# Patient Record
Sex: Female | Born: 1990 | Race: Black or African American | Hispanic: No | Marital: Single | Smoking: Never smoker
Health system: Southern US, Community
[De-identification: ages and names within clinical notes are randomized; demographics above are authoritative.]

## PROBLEM LIST (undated history)

## (undated) DIAGNOSIS — E282 Polycystic ovarian syndrome: Secondary | ICD-10-CM

## (undated) DIAGNOSIS — F419 Anxiety disorder, unspecified: Secondary | ICD-10-CM

## (undated) DIAGNOSIS — F32A Depression, unspecified: Secondary | ICD-10-CM

## (undated) DIAGNOSIS — Z202 Contact with and (suspected) exposure to infections with a predominantly sexual mode of transmission: Secondary | ICD-10-CM

## (undated) DIAGNOSIS — F329 Major depressive disorder, single episode, unspecified: Secondary | ICD-10-CM

## (undated) DIAGNOSIS — J302 Other seasonal allergic rhinitis: Secondary | ICD-10-CM

## (undated) DIAGNOSIS — N870 Mild cervical dysplasia: Secondary | ICD-10-CM

## (undated) HISTORY — DX: Anxiety disorder, unspecified: F41.9

## (undated) HISTORY — DX: Contact with and (suspected) exposure to infections with a predominantly sexual mode of transmission: Z20.2

## (undated) HISTORY — DX: Polycystic ovarian syndrome: E28.2

## (undated) HISTORY — DX: Other seasonal allergic rhinitis: J30.2

## (undated) HISTORY — DX: Major depressive disorder, single episode, unspecified: F32.9

## (undated) HISTORY — DX: Depression, unspecified: F32.A

## (undated) HISTORY — DX: Mild cervical dysplasia: N87.0

---
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
## 2012-01-04 ENCOUNTER — Other Ambulatory Visit (HOSPITAL_COMMUNITY)
Admission: RE | Admit: 2012-01-04 | Discharge: 2012-01-04 | Disposition: A | Discharge status: Home / Self Care | Payer: Federal, State, Local not specified - PPO | Source: Ambulatory Visit | Attending: Obstetrics and Gynecology | Admitting: Obstetrics and Gynecology

## 2012-01-04 ENCOUNTER — Encounter: Payer: Self-pay | Admitting: Obstetrics and Gynecology

## 2012-01-04 ENCOUNTER — Ambulatory Visit (INDEPENDENT_AMBULATORY_CARE_PROVIDER_SITE_OTHER): Payer: Federal, State, Local not specified - PPO | Admitting: Obstetrics and Gynecology

## 2012-01-04 VITALS — BP 128/78 | Ht 60.0 in | Wt 176.0 lb

## 2012-01-04 DIAGNOSIS — Z01419 Encounter for gynecological examination (general) (routine) without abnormal findings: Secondary | ICD-10-CM | POA: Insufficient documentation

## 2012-01-04 DIAGNOSIS — Z113 Encounter for screening for infections with a predominantly sexual mode of transmission: Secondary | ICD-10-CM

## 2012-01-04 LAB — CBC WITH DIFFERENTIAL/PLATELET
Basophils Absolute: 0 10*3/uL (ref 0.0–0.1)
Basophils Relative: 0 % (ref 0–1)
Eosinophils Absolute: 0.1 10*3/uL (ref 0.0–0.7)
Eosinophils Relative: 1 % (ref 0–5)
HCT: 38.2 % (ref 36.0–46.0)
Hemoglobin: 11.9 g/dL — ABNORMAL LOW (ref 12.0–15.0)
Lymphocytes Relative: 35 % (ref 12–46)
Lymphs Abs: 1.8 10*3/uL (ref 0.7–4.0)
MCH: 25.9 pg — ABNORMAL LOW (ref 26.0–34.0)
MCHC: 31.2 g/dL (ref 30.0–36.0)
MCV: 83 fL (ref 78.0–100.0)
Monocytes Absolute: 0.2 10*3/uL (ref 0.1–1.0)
Monocytes Relative: 5 % (ref 3–12)
Neutro Abs: 2.9 10*3/uL (ref 1.7–7.7)
Neutrophils Relative %: 59 % (ref 43–77)
Platelets: 280 10*3/uL (ref 150–400)
RBC: 4.6 MIL/uL (ref 3.87–5.11)
RDW: 14.3 % (ref 11.5–15.5)
WBC: 5 10*3/uL (ref 4.0–10.5)

## 2012-01-04 MED ORDER — DESOGESTREL-ETHINYL ESTRADIOL 0.15-0.02/0.01 MG (21/5) PO TABS: 1.0000 | ORAL_TABLET | Freq: Every day | ORAL | Status: DC

## 2012-01-04 NOTE — Progress Notes (Signed)
Patient came to see me today for her annual GYN exam. She remains on birth control pills and is doing well on them. She has short cycles without abnormal bleeding. She is not having pelvic pain. 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 Her last Pap smear was June of 2012 and was normal. High risk HPV is not been detected. She was declined as a blood doner due to anemia. She was last sexually active in March.  Physical examination: Beola Cord present. HEENT within normal limits. Neck: Thyroid not large. No masses. Supraclavicular nodes: not enlarged. Breasts: Examined in both sitting and lying  position. No skin changes and no masses. Abdomen: Soft no guarding rebound or masses or hernia. Pelvic: External: Within normal limits. BUS: Within normal limits. Vaginal:within normal limits. Good estrogen effect. No evidence of cystocele rectocele or enterocele. Cervix: clean. Uterus: Normal size and shape. Adnexa: No masses. Rectovaginal exam: Confirmatory and negative. Extremities: Within normal limits.  Assessment: CIN-1  Plan: Continue birth control pills. GC and chlamydial  cultures done. We decided to do a Pap smear today and then will go to every 3 year screening.

## 2012-01-05 LAB — URINALYSIS W MICROSCOPIC + REFLEX CULTURE
Bacteria, UA: NONE SEEN
Bilirubin Urine: NEGATIVE
Casts: NONE SEEN
Crystals: NONE SEEN
Glucose, UA: NEGATIVE mg/dL
Hgb urine dipstick: NEGATIVE
Ketones, ur: NEGATIVE mg/dL
Leukocytes, UA: NEGATIVE
Nitrite: NEGATIVE
Protein, ur: NEGATIVE mg/dL
Specific Gravity, Urine: 1.027 (ref 1.005–1.030)
Squamous Epithelial / LPF: NONE SEEN
Urobilinogen, UA: 0.2 mg/dL (ref 0.0–1.0)
pH: 6 (ref 5.0–8.0)

## 2012-01-05 LAB — GC/CHLAMYDIA PROBE AMP, GENITAL
Chlamydia, DNA Probe: NEGATIVE
GC Probe Amp, Genital: NEGATIVE

## 2012-01-25 ENCOUNTER — Ambulatory Visit (HOSPITAL_COMMUNITY): Payer: Federal, State, Local not specified - PPO | Admitting: Psychiatry

## 2012-03-07 ENCOUNTER — Other Ambulatory Visit: Payer: Self-pay | Admitting: Obstetrics and Gynecology

## 2012-03-28 ENCOUNTER — Ambulatory Visit (HOSPITAL_COMMUNITY): Payer: Federal, State, Local not specified - PPO | Admitting: Psychiatry

## 2012-03-28 ENCOUNTER — Encounter (HOSPITAL_COMMUNITY): Payer: Self-pay | Admitting: Psychiatry

## 2012-03-28 VITALS — BP 122/82 | Ht 60.0 in | Wt 176.6 lb

## 2012-03-28 DIAGNOSIS — F411 Generalized anxiety disorder: Secondary | ICD-10-CM

## 2012-03-28 MED ORDER — BUSPIRONE HCL 10 MG PO TABS: 10.0000 mg | ORAL_TABLET | Freq: Two times a day (BID) | ORAL | Status: DC

## 2012-03-28 NOTE — Progress Notes (Signed)
Patient ID: Yolanda Callahan, female   DOB: 01-30-1991, 21 y.o.   MRN: [id number]   [facility] Follow-up Outpatient Visit  Yolanda Callahan May 21, 1991  Date:   Subjective: I am doing well at college but I did change to [employer]. I'm taking honor classes. I'm doing well with my anxiety and I am also sleeping well at night. I'm not having any depressive symptoms, any thoughts of self-harm or harm to others. There are no side effects with the medications. There no safety issues as per patient's report at this visit   Filed Vitals:   03/28/12 1028  BP: 122/82    Mental Status Examination  Appearance: Casually depressed Alert: Yes Attention: fair  Cooperative: Yes Eye Contact: Fair Speech: Normal in volume, rate, tone, spontaneous  Psychomotor Activity: Normal Memory/Concentration: OK Oriented: person, place and situation Mood: Euthymic Affect: Full Range Thought Processes and Associations: Goal Directed Fund of Knowledge: Good Thought Content: Suicidal ideation, Homicidal ideation, Auditory hallucinations, Visual hallucinations, Delusions and Paranoia, none reported Insight: Fair Judgement: Fair  Diagnosis: Generalized anxiety disorder  Treatment Plan: Continue BuSpar to 10 mg one pill twice daily for anxiety Continue melatonin 3 mg one or 2 pills at bedtime when necessary for sleep. Discussed diet and exercise in length at this visit with patient as she is overweight  call when necessary Followup in 3 months  Nelly Rout, MD

## 2012-05-06 ENCOUNTER — Ambulatory Visit: Payer: Federal, State, Local not specified - PPO | Admitting: Women's Health

## 2012-05-06 ENCOUNTER — Ambulatory Visit (INDEPENDENT_AMBULATORY_CARE_PROVIDER_SITE_OTHER): Payer: Federal, State, Local not specified - PPO | Admitting: Emergency Medicine

## 2012-05-06 VITALS — BP 122/79 | HR 65 | Temp 98.2°F | Resp 16 | Ht 60.0 in | Wt 178.4 lb

## 2012-05-06 DIAGNOSIS — H1030 Unspecified acute conjunctivitis, unspecified eye: Secondary | ICD-10-CM

## 2012-05-06 MED ORDER — CIPROFLOXACIN HCL 0.3 % OP SOLN: 1.0000 [drp] | OPHTHALMIC | Status: DC

## 2012-05-06 NOTE — Progress Notes (Signed)
[facility] [address], [place] [address] [phone]  Date:  05/06/2012   Name:  Yolanda Callahan   DOB:  11/05/1990   MRN:  [id number]  PCP:  No primary provider on file.    Chief Complaint: Conjunctivitis   History of Present Illness:  Yolanda Callahan is a 21 y.o. very pleasant female patient who presents with the following:  One week history of right eye conjunctivitis now spreading to the left eye.  No FB sensation or visual symptoms.  Painless.  No antecedent illness.  Patient Active Problem List  Diagnosis  . GAD (generalized anxiety disorder)  . CIN I (cervical intraepithelial neoplasia I)    Past Medical History  Diagnosis Date  . Asthma   . Seasonal allergies   . Polycystic ovarian disease   . CIN I (cervical intraepithelial neoplasia I) 2012  . Anxiety     Past Surgical History  Procedure Date  . Mouth surgery   . Colposcopy 2012    History  Substance Use Topics  . Smoking status: Never Smoker   . Smokeless tobacco: Not on file  . Alcohol Use: No    Family History  Problem Relation Age of Onset  . Anxiety disorder Father   . Hypertension Father   . Anxiety disorder Paternal Grandmother   . Depression Paternal Grandmother   . Diabetes Paternal Grandmother   . Hypertension Mother   . Heart disease Maternal Grandmother   . Diabetes Paternal Grandfather   . Hypertension Maternal Grandfather     Allergies  Allergen Reactions  . Penicillins     Medication list has been reviewed and updated.  Current Outpatient Prescriptions on File Prior to Visit  Medication Sig Dispense Refill  . busPIRone (BUSPAR) 10 MG tablet Take 1 tablet (10 mg total) by mouth 2 (two) times daily.  60 tablet  2  . Multiple Vitamin (MULTIVITAMIN) tablet Take 1 tablet by mouth daily.      Marland Kitchen VIORELE 0.15-0.02/0.01 MG (21/5) tablet TAKE 1 TABLET BY MOUTH DAILY.  28 tablet  11  . desogestrel-ethinyl estradiol (KARIVA) 0.15-0.02/0.01 MG (21/5)  tablet Take 1 tablet by mouth daily.  1 Package  14    Review of Systems:  As per HPI, otherwise negative.    Physical Examination: Filed Vitals:   05/06/12 1450  BP: 122/79  Pulse: 65  Temp: 98.2 F (36.8 C)  Resp: 16   Filed Vitals:   05/06/12 1450  Height: 5' (1.524 m)  Weight: 178 lb 6.4 oz (80.922 kg)   Body mass index is 34.84 kg/(m^2). Ideal Body Weight: Weight in (lb) to have BMI = 25: 127.7    GEN: WDWN, NAD, Non-toxic, Alert & Oriented x 3 HEENT: Atraumatic, Normocephalic.  Ears and Nose: No external deformity. EXTR: No clubbing/cyanosis/edema NEURO: Normal gait.  PSYCH: Normally interactive. Conversant. Not depressed or anxious appearing.  Calm demeanor.  PRRERLA EOMI bilateral conjunctival injection and gluing.  No fb.  Assessment and Plan: Conjunctivitis. ciloxin   Carmelina Dane, MD  I have reviewed and agree with documentation. Robert P. Merla Riches, M.D.

## 2012-05-10 ENCOUNTER — Other Ambulatory Visit (HOSPITAL_COMMUNITY): Payer: Self-pay | Admitting: Psychiatry

## 2012-06-27 ENCOUNTER — Ambulatory Visit (HOSPITAL_COMMUNITY): Payer: Federal, State, Local not specified - PPO | Admitting: Psychiatry

## 2012-09-06 ENCOUNTER — Encounter (HOSPITAL_COMMUNITY): Payer: Self-pay | Admitting: Psychiatry

## 2012-09-06 ENCOUNTER — Ambulatory Visit (INDEPENDENT_AMBULATORY_CARE_PROVIDER_SITE_OTHER): Payer: Federal, State, Local not specified - PPO | Admitting: Psychiatry

## 2012-09-06 VITALS — BP 122/80 | Ht 60.0 in | Wt 182.6 lb

## 2012-09-06 DIAGNOSIS — F411 Generalized anxiety disorder: Secondary | ICD-10-CM

## 2012-09-06 MED ORDER — BUSPIRONE HCL 10 MG PO TABS: 10.0000 mg | ORAL_TABLET | Freq: Two times a day (BID) | ORAL | Status: DC

## 2012-09-06 NOTE — Progress Notes (Signed)
Patient ID: Yolanda Callahan, female   DOB: 09-25-1990, 22 y.o.   MRN: [id number]   [facility] Follow-up Outpatient Visit  DEXTER SAUSER 12-04-1990     Subjective: I doing well at [employer], my grades are good, and staying on campus but I do visit my family regularly. On a scale of 0-10, with 0 being no symptoms and 10 being the worst my anxiety is a 1/10. Patient denies any side effects of the medication, any complaints, any safety issues at this visit   Current outpatient prescriptions:busPIRone (BUSPAR) 10 MG tablet, Take 1 tablet (10 mg total) by mouth 2 (two) times daily., Disp: 60 tablet, Rfl: 3;  ciprofloxacin (CILOXAN) 0.3 % ophthalmic solution, Place 1 drop into both eyes every 2 (two) hours. Administer 1 drop, every 2 hours, while awake, for 2 days. Then 1 drop, every 4 hours, while awake, for the next 5 days., Disp: 5 mL, Rfl: 0 desogestrel-ethinyl estradiol (KARIVA) 0.15-0.02/0.01 MG (21/5) tablet, Take 1 tablet by mouth daily., Disp: 1 Package, Rfl: 14;  Multiple Vitamin (MULTIVITAMIN) tablet, Take 1 tablet by mouth daily., Disp: , Rfl: ;  VIORELE 0.15-0.02/0.01 MG (21/5) tablet, TAKE 1 TABLET BY MOUTH DAILY., Disp: 28 tablet, Rfl: 11 Filed Vitals:   09/06/12 1130  BP: 122/80   Review of Systems  Constitutional: Negative.  Negative for fever and weight loss.  HENT: Negative.  Negative for congestion and sore throat.   Cardiovascular: Negative.  Negative for chest pain and palpitations.  Neurological: Negative for weakness and headaches.  Psychiatric/Behavioral: Negative.  Negative for depression, suicidal ideas, hallucinations, memory loss and substance abuse. The patient is not nervous/anxious and does not have insomnia.    Mental Status Examination  Appearance: Casually depressed Alert: Yes Attention: fair  Cooperative: Yes Eye Contact: Fair Speech: Normal in volume, rate, tone, spontaneous  Psychomotor Activity: Normal Memory/Concentration:  OK Oriented: person, place and situation Mood: Euthymic Affect: Full Range Thought Processes and Associations: Goal Directed Fund of Knowledge: Good Thought Content: Suicidal ideation, Homicidal ideation, Auditory hallucinations, Visual hallucinations, Delusions and Paranoia, none reported Insight: Fair Judgement: Fair  Diagnosis: Generalized anxiety disorder  Treatment Plan: Continue BuSpar to 10 mg one pill twice daily for anxiety Continue melatonin 3 mg one or 2 pills at bedtime when necessary for sleep. Discussed again diet and exercise in length at this visit with patient as she is overweight but patient feels that she's not overweight and does not struggle with self image Call when necessary Followup in 4 months  Nelly Rout, MD

## 2012-10-27 ENCOUNTER — Encounter (HOSPITAL_COMMUNITY): Payer: Self-pay

## 2012-10-27 ENCOUNTER — Encounter (HOSPITAL_COMMUNITY): Payer: Self-pay | Admitting: Psychiatry

## 2012-10-27 ENCOUNTER — Ambulatory Visit (INDEPENDENT_AMBULATORY_CARE_PROVIDER_SITE_OTHER): Payer: Federal, State, Local not specified - PPO | Admitting: Psychiatry

## 2012-10-27 VITALS — BP 132/92 | Ht 60.0 in | Wt 187.4 lb

## 2012-10-27 DIAGNOSIS — F411 Generalized anxiety disorder: Secondary | ICD-10-CM

## 2012-10-27 DIAGNOSIS — F329 Major depressive disorder, single episode, unspecified: Secondary | ICD-10-CM | POA: Insufficient documentation

## 2012-10-27 MED ORDER — BUSPIRONE HCL 10 MG PO TABS: 10.0000 mg | ORAL_TABLET | Freq: Two times a day (BID) | ORAL | Status: DC

## 2012-10-27 MED ORDER — BUPROPION HCL ER (XL) 150 MG PO TB24: 150.0000 mg | ORAL_TABLET | ORAL | Status: DC

## 2012-10-27 NOTE — Progress Notes (Signed)
Patient ID: Yolanda Callahan, female   DOB: October 02, 1990, 22 y.o.   MRN: [id number]   [facility] Follow-up Outpatient Visit  Yolanda Callahan 16-Sep-1990     Subjective: I doing well at [employer], my grades are good but I am struggling with my mood,. I am tired a lot ,  I struggle with my concentration, I have had thoughts of not wanting to live but no thoughts of killing myself. I also get irritated easily. I am still staying on campus but I do visit my family regularly. Mom would prefer patient to return home so that the stress on the patient is less. On a scale of 0-10, with 0 being no symptoms and 10 being the worst my anxiety is a 6/10 and my depression is also a 6/10. Mom feels patient is doing too much as patient is in honor classes and is also working a lot. Patient denies any other complaints, any side effects of the medication, any safety issues at this visit   Active Ambulatory Problems    Diagnosis Date Noted  . GAD (generalized anxiety disorder) 07/23/2011  . CIN I (cervical intraepithelial neoplasia I)    Resolved Ambulatory Problems    Diagnosis Date Noted  . No Resolved Ambulatory Problems   Past Medical History  Diagnosis Date  . Asthma   . Seasonal allergies   . Polycystic ovarian disease   . Anxiety     Current outpatient prescriptions:buPROPion (WELLBUTRIN XL) 150 MG 24 hr tablet, Take 1 tablet (150 mg total) by mouth every morning., Disp: 30 tablet, Rfl: 2;  busPIRone (BUSPAR) 10 MG tablet, Take 1 tablet (10 mg total) by mouth 2 (two) times daily., Disp: 60 tablet, Rfl: 3 ciprofloxacin (CILOXAN) 0.3 % ophthalmic solution, Place 1 drop into both eyes every 2 (two) hours. Administer 1 drop, every 2 hours, while awake, for 2 days. Then 1 drop, every 4 hours, while awake, for the next 5 days., Disp: 5 mL, Rfl: 0;  desogestrel-ethinyl estradiol (KARIVA) 0.15-0.02/0.01 MG (21/5) tablet, Take 1 tablet by mouth daily., Disp: 1 Package, Rfl: 14 Multiple Vitamin  (MULTIVITAMIN) tablet, Take 1 tablet by mouth daily., Disp: , Rfl: ;  VIORELE 0.15-0.02/0.01 MG (21/5) tablet, TAKE 1 TABLET BY MOUTH DAILY., Disp: 28 tablet, Rfl: 11 Filed Vitals:   10/27/12 1039  BP: 132/92   Review of Systems  Constitutional: Negative.  Negative for fever and weight loss.  HENT: Negative.  Negative for congestion and sore throat.   Cardiovascular: Negative.  Negative for chest pain and palpitations.  Neurological: Negative.  Negative for dizziness, focal weakness, seizures, loss of consciousness, weakness and headaches.  Psychiatric/Behavioral: Positive for depression and suicidal ideas. Negative for hallucinations, memory loss and substance abuse. The patient is not nervous/anxious and does not have insomnia.    Mental Status Examination  Appearance: Casually depressed Alert: Yes Attention: fair  Cooperative: Yes Eye Contact: Fair Speech: Normal in volume, rate, tone, spontaneous  Psychomotor Activity: Normal Memory/Concentration: so, so Oriented: person, place and situation Mood: Anxious, Depressed and Dysphoric Affect: Full Range and ruminative thoughts. Thought Processes and Associations: Goal Directed Fund of Knowledge: Good Thought Content: Suicidal ideation, Homicidal ideation, Auditory hallucinations, Visual hallucinations, Delusions and Paranoia, none reported Insight: Fair Judgement: Fair  Diagnosis: Generalized anxiety disorder  Treatment Plan To start Wellbutrin XL 150 MG one daliy for depression and to help with focus. The risks and benefits were discussed with Mom and patient and verbal consent was obtained. Continue BuSpar to  10 mg one pill twice daily for anxiety Continue melatonin 3 mg one or 2 pills at bedtime when necessary for sleep. Discussed again diet and exercise in length at this visit  Call when necessary Followup in 4 weeks 50 % of the visit was spent in counseling the patient in regards to the diagnosis of  depression, treatment  options, coarse of the illness. Also crisis and safety plan was discussed even does patient denied any current safety concerns.  Nelly Rout, MD

## 2012-11-09 ENCOUNTER — Ambulatory Visit (HOSPITAL_COMMUNITY): Payer: Self-pay | Admitting: Psychiatry

## 2012-11-15 ENCOUNTER — Ambulatory Visit (INDEPENDENT_AMBULATORY_CARE_PROVIDER_SITE_OTHER): Payer: Federal, State, Local not specified - PPO | Admitting: Physician Assistant

## 2012-11-15 VITALS — BP 135/84 | HR 93 | Temp 98.0°F | Resp 16 | Ht 60.0 in | Wt 189.0 lb

## 2012-11-15 DIAGNOSIS — J45901 Unspecified asthma with (acute) exacerbation: Secondary | ICD-10-CM

## 2012-11-15 DIAGNOSIS — R059 Cough, unspecified: Secondary | ICD-10-CM

## 2012-11-15 DIAGNOSIS — R062 Wheezing: Secondary | ICD-10-CM

## 2012-11-15 DIAGNOSIS — R05 Cough: Secondary | ICD-10-CM

## 2012-11-15 DIAGNOSIS — R0602 Shortness of breath: Secondary | ICD-10-CM

## 2012-11-15 MED ORDER — ALBUTEROL SULFATE HFA 108 (90 BASE) MCG/ACT IN AERS: 2.0000 | INHALATION_SPRAY | RESPIRATORY_TRACT | Status: DC | PRN

## 2012-11-15 MED ORDER — MOMETASONE FUROATE 50 MCG/ACT NA SUSP: 2.0000 | Freq: Every day | NASAL | Status: DC

## 2012-11-15 MED ORDER — E-Z SPACER DEVI: Status: DC

## 2012-11-15 MED ORDER — MONTELUKAST SODIUM 10 MG PO TABS: 10.0000 mg | ORAL_TABLET | Freq: Every day | ORAL | Status: DC

## 2012-11-15 MED ORDER — HYDROCODONE-HOMATROPINE 5-1.5 MG/5ML PO SYRP: ORAL_SOLUTION | ORAL | Status: DC

## 2012-11-15 MED ORDER — IPRATROPIUM BROMIDE 0.02 % IN SOLN
0.5000 mg | Freq: Once | RESPIRATORY_TRACT | Status: AC
  Administered 2012-11-15: 0.5 mg via RESPIRATORY_TRACT

## 2012-11-15 MED ORDER — ALBUTEROL SULFATE (2.5 MG/3ML) 0.083% IN NEBU
2.5000 mg | INHALATION_SOLUTION | Freq: Once | RESPIRATORY_TRACT | Status: AC
  Administered 2012-11-15: 2.5 mg via RESPIRATORY_TRACT

## 2012-11-15 MED ORDER — PREDNISONE 20 MG PO TABS: ORAL_TABLET | ORAL | Status: DC

## 2012-11-15 NOTE — Progress Notes (Signed)
Patient ID: Yolanda Callahan MRN: [id number], DOB: July 25, 1990, 22 y.o. Date of Encounter: 11/15/2012, 4:02 PM  Primary Physician: No primary provider on file.  Chief Complaint: Asthma   HPI: 22 y.o. female with history below presents with asthma exacerbation. States her asthma always flares up this time of year with the allergies. Her allergies have since calmed down some. She notes increased cough, SOB, and wheezing. Symptoms are worse at night. She has been using her Albuterol inhaler 4-5 times per day and even more at night time. Her roommate has been insisting that the bedroom window stay open at nighttime along with the fan, this is causing a flare up of her symptoms. Some sore throat, however she feels like this is secondary to irritation from coughing only. She denies any fevers, chills, itchy, watery eyes, or N/V/D. She does not feel air hungry. States her asthma always flares up this time of year.   Previously she has taken Singulair nightly for her allergies and asthma and had good results. She last took this during her senior of high school. At baseline she does not require usage of her Albuterol and her asthma is well controlled.     Past Medical History  Diagnosis Date  . Asthma   . Seasonal allergies   . Polycystic ovarian disease   . CIN I (cervical intraepithelial neoplasia I) 2012  . Anxiety   . Depression      Home Meds: Prior to Admission medications   Medication Sig Start Date End Date Taking? Authorizing Provider  buPROPion (WELLBUTRIN XL) 150 MG 24 hr tablet Take 1 tablet (150 mg total) by mouth every morning. 10/27/12 10/27/13 Yes Nelly Rout, MD  busPIRone (BUSPAR) 10 MG tablet Take 1 tablet (10 mg total) by mouth 2 (two) times daily. 10/27/12  Yes Nelly Rout, MD  Multiple Vitamin (MULTIVITAMIN) tablet Take 1 tablet by mouth daily.   Yes Historical Provider, MD  VIORELE 0.15-0.02/0.01 MG (21/5) tablet TAKE 1 TABLET BY MOUTH DAILY. 03/07/12  Yes Trellis Paganini, MD  desogestrel-ethinyl estradiol (KARIVA) 0.15-0.02/0.01 MG (21/5) tablet Take 1 tablet by mouth daily. 01/04/12 01/03/13 No Trellis Paganini, MD    Allergies:  Allergies  Allergen Reactions  . Penicillins     History   Social History  . Marital Status: Single    Spouse Name: N/A    Number of Children: N/A  . Years of Education: N/A   Occupational History  . Not on file.   Social History Main Topics  . Smoking status: Never Smoker   . Smokeless tobacco: Not on file  . Alcohol Use: No  . Drug Use: No  . Sexually Active: Yes    Birth Control/ Protection: Pill   Other Topics Concern  . Not on file   Social History Narrative  . No narrative on file     Review of Systems: Constitutional: positive for fatigue. negative for chills or fever  HEENT: positive for congestion, rhinorrhea, and ST. negative for vision changes, hearing loss, or sinus pressure Cardiovascular: negative for chest pain or palpitations Respiratory: positive for cough, SOB, and wheezing. negative for hemoptysis, or chest pain   Physical Exam: Blood pressure 135/84, pulse 93, temperature 98 F (36.7 C), temperature source Oral, resp. rate 16, height 5' (1.524 m), weight 189 lb (85.73 kg), last menstrual period 10/15/2012, SpO2 96.00%., Body mass index is 36.91 kg/(m^2). General: Well developed, well nourished, in no acute distress. Head: Normocephalic, atraumatic, eyes without discharge, sclera non-icteric,  nares are without discharge. Bilateral auditory canals clear, TM's are without perforation, pearly grey and translucent with reflective cone of light bilaterally. Oral cavity moist, posterior pharynx with post nasal drip. No exudate, erythema, or peritonsillar abscess. Uvula midline.  Neck: Supple. No thyromegaly. Full ROM. No lymphadenopathy. Lungs: Expiratory wheezing bilaterally. No rales or rhonchi. Breathing is unlabored. Heart: RRR with S1 S2. No murmurs, rubs, or gallops  appreciated. Msk:  Strength and tone normal for age. Extremities/Skin: Warm and dry. No clubbing or cyanosis. No edema. No rashes or suspicious lesions. Neuro: Alert and oriented X 3. Moves all extremities spontaneously. Gait is normal. CNII-XII grossly in tact. Psych:  Responds to questions appropriately with a normal affect.   S/P Albuterol and Atrovent neb:    ASSESSMENT AND PLAN:  22 y.o. female with acute asthma exacerbation, SOB, wheezing, cough, and allergies  -Albuterol and Atrovent neb per above -Prednisone 20 mg #18 3x3, 2x3, 1x3 no RF -Proventil 2 puffs inhaled q 4-6 hours prn #1 no RF -Hycodan #4oz 1 tsp po q 4-6 hours prn cough no RF SED -Singulair 10 mg 1 po qhs #30 RF 11 -Nasonex 2 sprays daily #1 RF 12 -1 tsp po of local honey daily -Change air filters regularly with quality air filters -Discuss asthma with roommate, may need to stay with parents at this time -RTC precautions   Signed, Eula Listen, PA-C 11/15/2012 4:02 PM

## 2012-11-23 ENCOUNTER — Ambulatory Visit (INDEPENDENT_AMBULATORY_CARE_PROVIDER_SITE_OTHER): Payer: Federal, State, Local not specified - PPO | Admitting: Psychiatry

## 2012-11-23 DIAGNOSIS — F411 Generalized anxiety disorder: Secondary | ICD-10-CM

## 2012-11-23 DIAGNOSIS — F32A Depression, unspecified: Secondary | ICD-10-CM

## 2012-11-23 DIAGNOSIS — F329 Major depressive disorder, single episode, unspecified: Secondary | ICD-10-CM

## 2012-11-24 ENCOUNTER — Encounter (HOSPITAL_COMMUNITY): Payer: Self-pay | Admitting: Psychiatry
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
## 2012-12-06 ENCOUNTER — Ambulatory Visit (HOSPITAL_COMMUNITY): Payer: Self-pay | Admitting: Psychiatry

## 2012-12-07 ENCOUNTER — Ambulatory Visit (INDEPENDENT_AMBULATORY_CARE_PROVIDER_SITE_OTHER): Payer: Federal, State, Local not specified - PPO | Admitting: Psychiatry

## 2012-12-07 ENCOUNTER — Encounter (HOSPITAL_COMMUNITY): Payer: Self-pay | Admitting: Psychiatry

## 2012-12-07 DIAGNOSIS — F329 Major depressive disorder, single episode, unspecified: Secondary | ICD-10-CM

## 2012-12-07 NOTE — Progress Notes (Signed)
   THERAPIST PROGRESS NOTE  Session Time: 11:30-12:20  Participation Level: Active  Behavioral Response: CasualAlertEuthymic  Type of Therapy: Individual Therapy  Treatment Goals addressed: coping skills, boundary setting  Interventions: CBT  Summary: Yolanda Callahan is a 22 y.o. female who presents with depression and anxiety.   Suicidal/Homicidal: Nowithout intent/plan  Therapist Response: Client processed decision to move home from on campus housing and career planning. Pt. Reported significant improvements in healthy diet, exercise 3X a week and improvements in sleep schedule, considering limiting social media.  Plan: Return again in 1-2 weeks.  Diagnosis: Axis I: Depressive Disorder NOS    Axis II: No diagnosis    [name] 12/07/2012

## 2012-12-19 ENCOUNTER — Ambulatory Visit (HOSPITAL_COMMUNITY): Payer: Self-pay | Admitting: Psychiatry

## 2012-12-19 ENCOUNTER — Ambulatory Visit (HOSPITAL_COMMUNITY): Payer: Federal, State, Local not specified - PPO | Admitting: Psychiatry

## 2012-12-21 ENCOUNTER — Ambulatory Visit (INDEPENDENT_AMBULATORY_CARE_PROVIDER_SITE_OTHER): Payer: Federal, State, Local not specified - PPO | Admitting: Psychiatry

## 2012-12-21 DIAGNOSIS — F411 Generalized anxiety disorder: Secondary | ICD-10-CM

## 2012-12-21 DIAGNOSIS — F329 Major depressive disorder, single episode, unspecified: Secondary | ICD-10-CM

## 2012-12-22 ENCOUNTER — Encounter (HOSPITAL_COMMUNITY): Payer: Self-pay | Admitting: Psychiatry

## 2012-12-22 NOTE — Progress Notes (Signed)
   THERAPIST PROGRESS NOTE  Session Time: 11:30-12:20  Participation Level: Active  Behavioral Response: CasualAlertEuthymic  Type of Therapy: Individual Therapy  Treatment Goals addressed: anxiety  Interventions: CBT  Summary: Yolanda Callahan is a 22 y.o. female who presents with anxiety and depression.   Suicidal/Homicidal: Nowithout intent/plan  Therapist Response: Pt. Reported improvement in anxiety as evidenced by ability to focus on healthy eating plan and regular exercise. Pt. Reported good relationships with primary support system- parents, brother, friends. Focused on themes related to self-worth, people pleasing/fears of confrontation. Encouraged Pt. To identify interests and activities that bring her joy. Pt. Was able to identify sitting on the lawn, drawing, and reading.  Plan: Return again in 1-2 weeks.  Diagnosis: Axis I: Depressive Disorder NOS    Axis II: No diagnosis    Yolanda Callahan 12/22/2012

## 2013-01-04 ENCOUNTER — Encounter: Payer: Self-pay | Admitting: Women's Health

## 2013-01-04 ENCOUNTER — Ambulatory Visit (INDEPENDENT_AMBULATORY_CARE_PROVIDER_SITE_OTHER): Payer: Federal, State, Local not specified - PPO | Admitting: Women's Health

## 2013-01-04 VITALS — BP 118/76 | Ht 60.0 in | Wt 180.0 lb

## 2013-01-04 DIAGNOSIS — Z01419 Encounter for gynecological examination (general) (routine) without abnormal findings: Secondary | ICD-10-CM

## 2013-01-04 DIAGNOSIS — IMO0001 Reserved for inherently not codable concepts without codable children: Secondary | ICD-10-CM

## 2013-01-04 DIAGNOSIS — N912 Amenorrhea, unspecified: Secondary | ICD-10-CM

## 2013-01-04 DIAGNOSIS — Z23 Encounter for immunization: Secondary | ICD-10-CM

## 2013-01-04 DIAGNOSIS — Z113 Encounter for screening for infections with a predominantly sexual mode of transmission: Secondary | ICD-10-CM

## 2013-01-04 DIAGNOSIS — Z309 Encounter for contraceptive management, unspecified: Secondary | ICD-10-CM

## 2013-01-04 DIAGNOSIS — Z833 Family history of diabetes mellitus: Secondary | ICD-10-CM

## 2013-01-04 LAB — CBC WITH DIFFERENTIAL/PLATELET
Basophils Absolute: 0 10*3/uL (ref 0.0–0.1)
Basophils Relative: 0 % (ref 0–1)
Eosinophils Absolute: 0.1 10*3/uL (ref 0.0–0.7)
Eosinophils Relative: 3 % (ref 0–5)
HCT: 36.4 % (ref 36.0–46.0)
Hemoglobin: 12.4 g/dL (ref 12.0–15.0)
Lymphocytes Relative: 36 % (ref 12–46)
Lymphs Abs: 1.7 10*3/uL (ref 0.7–4.0)
MCH: 26.6 pg (ref 26.0–34.0)
MCHC: 34.1 g/dL (ref 30.0–36.0)
MCV: 78.1 fL (ref 78.0–100.0)
Monocytes Absolute: 0.5 10*3/uL (ref 0.1–1.0)
Monocytes Relative: 10 % (ref 3–12)
Neutro Abs: 2.4 10*3/uL (ref 1.7–7.7)
Neutrophils Relative %: 51 % (ref 43–77)
Platelets: 294 10*3/uL (ref 150–400)
RBC: 4.66 MIL/uL (ref 3.87–5.11)
RDW: 14.5 % (ref 11.5–15.5)
WBC: 4.8 10*3/uL (ref 4.0–10.5)

## 2013-01-04 LAB — GLUCOSE, RANDOM: Glucose, Bld: 87 mg/dL (ref 70–99)

## 2013-01-04 LAB — PROLACTIN: Prolactin: 23.5 ng/mL

## 2013-01-04 LAB — HCG, SERUM, QUALITATIVE: Preg, Serum: NEGATIVE

## 2013-01-04 LAB — TSH: TSH: 2.065 u[IU]/mL (ref 0.350–4.500)

## 2013-01-04 MED ORDER — DESOGESTREL-ETHINYL ESTRADIOL 0.15-0.02/0.01 MG (21/5) PO TABS: ORAL_TABLET | ORAL | Status: DC

## 2013-01-04 NOTE — Patient Instructions (Addendum)
Health Maintenance, 18- to 21-Year-Old SCHOOL PERFORMANCE After high school completion, the young adult may be attending college, technical or vocational school, or entering the military or the work force. SOCIAL AND EMOTIONAL DEVELOPMENT The young adult establishes adult relationships and explores sexual identity. Young adults may be living at home or in a college dorm or apartment. Increasing independence is important with young adults. Throughout adolescence, teens should assume responsibility of their own health care. IMMUNIZATIONS Most young adults should be fully vaccinated. A booster dose of Tdap (tetanus, diphtheria, and pertussis, or "whooping cough"), a dose of meningococcal vaccine to protect against a certain type of bacterial meningitis, hepatitis A, human papillomarvirus (HPV), chickenpox, or measles vaccines may be indicated, if not given at an earlier age. Annual influenza or "flu" vaccination should be considered during flu season.  TESTING Annual screening for vision and hearing problems is recommended. Vision should be screened objectively at least once between 18 and 21 years of age. The young adult may be screened for anemia or tuberculosis. Young adults should have a blood test to check for high cholesterol during this time period. Young adults should be screened for use of alcohol and drugs. If the young adult is sexually active, screening for sexually transmitted infections, pregnancy, or HIV may be performed. Screening for cervical cancer should be performed within 3 years of beginning sexual activity. NUTRITION AND ORAL HEALTH  Adequate calcium intake is important. Consume 3 servings of low-fat milk and dairy products daily. For those who do not drink milk or consume dairy products, calcium enriched foods, such as juice, bread, or cereal, dark, leafy greens, or canned fish are alternate sources of calcium.  Drink plenty of water. Limit fruit juice to 8 to 12 ounces per day.  Avoid sugary beverages or sodas.  Discourage skipping meals, especially breakfast. Teens should eat a good variety of vegetables and fruits, as well as lean meats.  Avoid high fat, high salt, and high sugar foods, such as candy, chips, and cookies.  Encourage young adults to participate in meal planning and preparation.  Eat meals together as a family whenever possible. Encourage conversation at mealtime.  Limit fast food choices and eating out at restaurants.  Brush teeth twice a day and floss.  Schedule dental exams twice a year. SLEEP Regular sleep habits are important. PHYSICAL, SOCIAL, AND EMOTIONAL DEVELOPMENT  One hour of regular physical activity daily is recommended. Continue to participate in sports.  Encourage young adults to develop their own interests and consider community service or volunteerism.  Provide guidance to the young adult in making decisions about college and work plans.  Make sure that young adults know that they should never be in a situation that makes them uncomfortable, and they should tell partners if they do not want to engage in sexual activity.  Talk to the young adult about body image. Eating disorders may be noted at this time. Young adults may also be concerned about being overweight. Monitor the young adult for weight gain or loss.  Mood disturbances, depression, anxiety, alcoholism, or attention problems may be noted in young adults. Talk to the caregiver if there are concerns about mental illness.  Negotiate limit setting and independent decision making.  Encourage the young adult to handle conflict without physical violence.  Avoid loud noises which may impair hearing.  Limit television and computer time to 2 hours per day. Individuals who engage in excessive sedentary activity are more likely to become overweight. RISK BEHAVIORS  Sexually active   young adults need to take precautions against pregnancy and sexually transmitted  infections. Talk to young adults about contraception.  Provide a tobacco-free and drug-free environment for the young adult. Talk to the young adult about drug, tobacco, and alcohol use among friends or at friends' homes. Make sure the young adult knows that smoking tobacco or marijuana and taking drugs have health consequences and may impact brain development.  Teach the young adult about appropriate use of over-the-counter or prescription medicines.  Establish guidelines for driving and for riding with friends.  Talk to young adults about the risks of drinking and driving or boating. Encourage the young adult to call you if he or she or friends have been drinking or using drugs.  Remind young adults to wear seat belts at all times in cars and life vests in boats.  Young adults should always wear a properly fitted helmet when they are riding a bicycle.  Use caution with all-terrain vehicles (ATVs) or other motorized vehicles.  Do not keep handguns in the home. (If you do, the gun and ammunition should be locked separately and out of the young adult's access.)  Equip your home with smoke detectors and change the batteries regularly. Make sure all family members know the fire escape plans for your home.  Teach young adults not to swim alone and not to dive in shallow water.  All individuals should wear sunscreen that protects against UVA and UVB light with at least a sun protection factor (SPF) of 30 when out in the sun. This minimizes sun burning. WHAT'S NEXT? Young adults should visit their pediatrician or family physician yearly. By young adulthood, health care should be transitioned to a family physician or internal medicine specialist. Sexually active females may want to begin annual physical exams with a gynecologist. Document Released: 10/01/2006 Document Revised: 09/28/2011 Document Reviewed: 10/21/2006 ExitCare Patient Information 2014 [employer].  

## 2013-01-04 NOTE — Addendum Note (Signed)
Addended by: Richardson Chiquito on: 01/04/2013 10:03 AM   Modules accepted: Orders

## 2013-01-04 NOTE — Progress Notes (Signed)
Yolanda Callahan [birth year] [id number]    History:    The patient presents for annual exam.  History of CIN-1 2012 with colposcopy,  normal Pap  12/2010 and 12/2011. Same partner but questions faithfulness. Anxiety and depression primary care manages medications. On Viorele stopped in April questions if conceived in May has had negative home pregnancy tests, history of PCO S.   Past medical history, past surgical history, family history and social history were all reviewed and documented in the EPIC chart. 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 Parents hypertension, father anxiety.   ROS:  A  ROS was performed and pertinent positives and negatives are included in the history.  Exam:  Filed Vitals:   01/04/13 0801  BP: 118/76    General appearance:  Normal Head/Neck:  Normal, without cervical or supraclavicular adenopathy. Thyroid:  Symmetrical, normal in size, without palpable masses or nodularity. Respiratory  Effort:  Normal  Auscultation:  Clear without wheezing or rhonchi Cardiovascular  Auscultation:  Regular rate, without rubs, murmurs or gallops  Edema/varicosities:  Not grossly evident Abdominal  Soft,nontender, without masses, guarding or rebound.  Liver/spleen:  No organomegaly noted  Hernia:  None appreciated  Skin  Inspection:  Grossly normal  Palpation:  Grossly normal Neurologic/psychiatric  Orientation:  Normal with appropriate conversation.  Mood/affect:  Normal  Genitourinary    Breasts: Examined lying and sitting.     Right: Without masses, retractions, discharge or axillary adenopathy.     Left: Without masses, retractions, discharge or axillary adenopathy.   Inguinal/mons:  Normal without inguinal adenopathy  External genitalia:  Normal  BUS/Urethra/Skene's glands:  Normal  Bladder:  Normal  Vagina:  Normal  Cervix:  Normal, scant brown blood, GC/Chlamydia culture pending  Uterus:   normal in size, shape and contour.  Midline and mobile  Adnexa/parametria:      Rt: Without masses or tenderness.   Lt: Without masses or tenderness.  Anus and perineum: Normal   Assessment/Plan:  22 y.o. SBF G0 for annual exam with amenorrhea.  Amenorrhea Obesity Questionable PCO S. STD screen Anxiety/depression-primary care manages  Plan: CBC, TSH, prolactin, qualitative hCG, UA,  GC/Chlamydia, declines HIV, hepatitis or RPR. Reviewed it appears menses has started today if qualitative hCG negative will start back on viorele, risk for blood clots and strokes reviewed, take daily condoms if sexually active. Gardasil first given today, reviewed importance of followup in 2 and 6 months to complete the 3 shot series. Has had 1 gardasil in the past but did not return. SBE's, exercise, calcium rich diet, MVI daily, increase iron rich foods. Campus safety reviewed. Discussed importance of decreasing calories and increasing exercise for weight loss for health.   Harrington Challenger [facility], 8:36 AM 01/04/2013

## 2013-01-05 LAB — URINALYSIS W MICROSCOPIC + REFLEX CULTURE
Bilirubin Urine: NEGATIVE
Casts: NONE SEEN
Crystals: NONE SEEN
Glucose, UA: NEGATIVE mg/dL
Ketones, ur: NEGATIVE mg/dL
Leukocytes, UA: NEGATIVE
Nitrite: NEGATIVE
Protein, ur: NEGATIVE mg/dL
Specific Gravity, Urine: 1.026 (ref 1.005–1.030)
Urobilinogen, UA: 0.2 mg/dL (ref 0.0–1.0)
pH: 6 (ref 5.0–8.0)

## 2013-01-05 LAB — GC/CHLAMYDIA PROBE AMP
CT Probe RNA: NEGATIVE
GC Probe RNA: NEGATIVE

## 2013-01-07 LAB — URINE CULTURE: Colony Count: 50000

## 2013-01-16 ENCOUNTER — Other Ambulatory Visit: Payer: Self-pay | Admitting: Women's Health

## 2013-01-16 DIAGNOSIS — R3129 Other microscopic hematuria: Secondary | ICD-10-CM

## 2013-01-24 ENCOUNTER — Encounter (HOSPITAL_COMMUNITY): Payer: Self-pay | Admitting: Psychiatry

## 2013-01-24 ENCOUNTER — Ambulatory Visit (INDEPENDENT_AMBULATORY_CARE_PROVIDER_SITE_OTHER): Payer: Federal, State, Local not specified - PPO | Admitting: Psychiatry

## 2013-01-24 VITALS — BP 122/80 | Ht 60.0 in | Wt 186.8 lb

## 2013-01-24 DIAGNOSIS — F411 Generalized anxiety disorder: Secondary | ICD-10-CM

## 2013-01-24 DIAGNOSIS — F329 Major depressive disorder, single episode, unspecified: Secondary | ICD-10-CM

## 2013-01-24 MED ORDER — BUPROPION HCL ER (XL) 150 MG PO TB24: 150.0000 mg | ORAL_TABLET | ORAL | Status: DC

## 2013-01-24 MED ORDER — BUSPIRONE HCL 10 MG PO TABS: 10.0000 mg | ORAL_TABLET | Freq: Two times a day (BID) | ORAL | Status: DC

## 2013-01-24 NOTE — Progress Notes (Signed)
Patient ID: Yolanda Callahan, female   DOB: Dec 02, 1990, 22 y.o.   MRN: [id number]   [facility] Follow-up Outpatient Visit  Yolanda Callahan May 08, 1991     Subjective: Patient is a 22 year old female diagnosed with major depressive disorder, generalized anxiety disorder who presents today for a followup visit.   I doing well with my depression and anxiety.On a scale of 0-10, with 0 being no symptoms and 10 being the worst my anxiety is a 1/10 and my depression is also a 1/10. Mom feels patient is doing too much as patient is in honor classes and is also working a lot. Patient denies any complaints, any side effects of the medication, any safety issues at this visit   Active Ambulatory Problems    Diagnosis Date Noted  . GAD (generalized anxiety disorder) 07/23/2011  . CIN I (cervical intraepithelial neoplasia I)   . MDD (major depressive disorder) 10/27/2012   Resolved Ambulatory Problems    Diagnosis Date Noted  . No Resolved Ambulatory Problems   Past Medical History  Diagnosis Date  . Asthma   . Seasonal allergies   . Polycystic ovarian disease   . Anxiety   . Depression     Current outpatient prescriptions:albuterol (PROVENTIL HFA;VENTOLIN HFA) 108 (90 BASE) MCG/ACT inhaler, Inhale 2 puffs into the lungs every 4 (four) hours as needed for wheezing., Disp: 1 Inhaler, Rfl: 2;  buPROPion (WELLBUTRIN XL) 150 MG 24 hr tablet, Take 1 tablet (150 mg total) by mouth every morning., Disp: 30 tablet, Rfl: 2;  busPIRone (BUSPAR) 10 MG tablet, Take 1 tablet (10 mg total) by mouth 2 (two) times daily., Disp: 60 tablet, Rfl: 3 desogestrel-ethinyl estradiol (VIORELE) 0.15-0.02/0.01 MG (21/5) tablet, TAKE 1 TABLET BY MOUTH DAILY., Disp: 28 tablet, Rfl: 11;  mometasone (NASONEX) 50 MCG/ACT nasal spray, Place 2 sprays into the nose daily., Disp: 17 g, Rfl: 12;  montelukast (SINGULAIR) 10 MG tablet, Take 1 tablet (10 mg total) by mouth at bedtime., Disp: 30 tablet, Rfl: 11;  Multiple  Vitamin (MULTIVITAMIN) tablet, Take 1 tablet by mouth daily., Disp: , Rfl:  Spacer/Aero-Holding Chambers (E-Z SPACER) inhaler, Use as instructed, Disp: 1 each, Rfl: 2 Filed Vitals:   01/24/13 0942  BP: 122/80   Review of Systems  Constitutional: Negative.  Negative for fever and weight loss.  HENT: Negative.  Negative for congestion and sore throat.   Cardiovascular: Negative.  Negative for chest pain and palpitations.  Neurological: Negative.  Negative for dizziness, focal weakness, seizures, loss of consciousness, weakness and headaches.  Psychiatric/Behavioral: Negative for depression, suicidal ideas, hallucinations, memory loss and substance abuse. The patient is not nervous/anxious and does not have insomnia.    Mental Status Examination  Appearance: Casually dressed Alert: Yes Attention: fair  Cooperative: Yes Eye Contact: Fair Speech: Normal in volume, rate, tone, spontaneous  Psychomotor Activity: Normal Memory/Concentration: OK Oriented: person, place and situation Mood: Euthymic Affect: Full Range Thought Processes and Associations: Goal Directed Fund of Knowledge: Good Thought Content: Suicidal ideation, Homicidal ideation, Auditory hallucinations, Visual hallucinations, Delusions and Paranoia, none reported Insight: Fair Judgement: Fair  Diagnosis: Generalized anxiety disorder, major depressive disorder  Treatment Plan Continue Wellbutrin XL 150 MG one daliy for depression and to help with focus.  Continue BuSpar to 10 mg one pill twice daily for anxiety Continue melatonin 3 mg one or 2 pills at bedtime when necessary for sleep. Discussed again diet and exercise in length at this visit as patient has gained 6 lbs since her last  visit. Call when necessary Followup in 3 months 50 % of the visit was spent in counseling the patient again  in regards to the symptoms of  Depression. Also crisis and safety plan was discussed even does patient denied any current safety  concerns.  Nelly Rout, MD

## 2013-02-02 ENCOUNTER — Ambulatory Visit (HOSPITAL_COMMUNITY): Payer: Self-pay | Admitting: Psychiatry

## 2013-02-16 ENCOUNTER — Ambulatory Visit (INDEPENDENT_AMBULATORY_CARE_PROVIDER_SITE_OTHER): Payer: Federal, State, Local not specified - PPO | Admitting: Emergency Medicine

## 2013-02-16 VITALS — BP 118/72 | HR 72 | Temp 98.1°F | Resp 18 | Ht 60.5 in | Wt 187.0 lb

## 2013-02-16 DIAGNOSIS — Z0000 Encounter for general adult medical examination without abnormal findings: Secondary | ICD-10-CM

## 2013-02-16 NOTE — Progress Notes (Signed)
[facility] [address] [place] [address] [phone]  Date:  02/16/2013   Name:  Yolanda Callahan   DOB:  January 09, 1991   MRN:  [id number]  PCP:  No PCP Per Patient    Chief Complaint: Annual Exam   History of Present Illness:  Yolanda Callahan is a 22 y.o. very pleasant female patient who presents with the following:  Wellness examination.  Denies other complaint or health concern today.   Patient Active Problem List   Diagnosis Date Noted  . MDD (major depressive disorder) 10/27/2012  . CIN I (cervical intraepithelial neoplasia I)   . GAD (generalized anxiety disorder) 07/23/2011    Past Medical History  Diagnosis Date  . Asthma   . Seasonal allergies   . Polycystic ovarian disease   . CIN I (cervical intraepithelial neoplasia I) 2012  . Anxiety   . Depression     Past Surgical History  Procedure Laterality Date  . Mouth surgery    . Colposcopy  2012    History  Substance Use Topics  . Smoking status: Former Games developer  . Smokeless tobacco: Not on file  . Alcohol Use: 1.5 oz/week    3 drink(s) per week    Family History  Problem Relation Age of Onset  . Anxiety disorder Father   . Hypertension Father   . Anxiety disorder Paternal Grandmother   . Depression Paternal Grandmother   . Diabetes Paternal Grandmother   . Hypertension Mother   . Heart disease Maternal Grandmother   . Diabetes Paternal Grandfather   . Hypertension Maternal Grandfather     Allergies  Allergen Reactions  . Penicillins     Medication list has been reviewed and updated.  Current Outpatient Prescriptions on File Prior to Visit  Medication Sig Dispense Refill  . albuterol (PROVENTIL HFA;VENTOLIN HFA) 108 (90 BASE) MCG/ACT inhaler Inhale 2 puffs into the lungs every 4 (four) hours as needed for wheezing.  1 Inhaler  2  . buPROPion (WELLBUTRIN XL) 150 MG 24 hr tablet Take 1 tablet (150 mg total) by mouth every morning.  30 tablet  2  . busPIRone  (BUSPAR) 10 MG tablet Take 1 tablet (10 mg total) by mouth 2 (two) times daily.  60 tablet  3  . desogestrel-ethinyl estradiol (VIORELE) 0.15-0.02/0.01 MG (21/5) tablet TAKE 1 TABLET BY MOUTH DAILY.  28 tablet  11  . Multiple Vitamin (MULTIVITAMIN) tablet Take 1 tablet by mouth daily.      . mometasone (NASONEX) 50 MCG/ACT nasal spray Place 2 sprays into the nose daily.  17 g  12  . montelukast (SINGULAIR) 10 MG tablet Take 1 tablet (10 mg total) by mouth at bedtime.  30 tablet  11  . Spacer/Aero-Holding Chambers (E-Z SPACER) inhaler Use as instructed  1 each  2   No current facility-administered medications on file prior to visit.    Review of Systems:  As per HPI, otherwise negative.    Physical Examination: Filed Vitals:   02/16/13 1246  BP: 118/72  Pulse: 72  Temp: 98.1 F (36.7 C)  Resp: 18   Filed Vitals:   02/16/13 1246  Height: 5' 0.5" (1.537 m)  Weight: 187 lb (84.823 kg)   Body mass index is 35.91 kg/(m^2). Ideal Body Weight: Weight in (lb) to have BMI = 25: 129.9  GEN: WDWN, NAD, Non-toxic, A & O x 3 HEENT: Atraumatic, Normocephalic. Neck supple. No masses, No LAD. Ears and Nose: No external deformity.  CV: RRR, No M/G/R. No JVD. No thrill. No extra heart sounds. PULM: CTA B, no wheezes, crackles, rhonchi. No retractions. No resp. distress. No accessory muscle use. ABD: S, NT, ND, +BS. No rebound. No HSM. EXTR: No c/c/e NEURO Normal gait.  PSYCH: Normally interactive. Conversant. Not depressed or anxious appearing.  Calm demeanor.    Assessment and Plan: Wellness examination TB screening completed   Signed,  Phillips Odor, MD

## 2013-02-16 NOTE — Progress Notes (Signed)
  Tuberculosis Risk Questionnaire  1. Were you born outside the [place] in one of the following parts of the world: [place], [place], [place], [place] America or Eastern [place]?  No  2. Have you traveled outside the [place] and lived for more than one month in one of the following parts of the world: [place], [place], [place], [place] or [place]?  No  3. Do you have a compromised immune system such as from any of the following conditions:HIV/AIDS, organ or bone marrow transplantation, diabetes, immunosuppressive medicines (e.g. Prednisone, Remicaide), leukemia, lymphoma, cancer of the head or neck, gastrectomy or jejunal bypass, end-stage renal disease (on dialysis), or silicosis?  No   4. Have you ever or do you plan on working in: a residential care center, a health care facility, a jail or prison or homeless shelter?  No  5. Have you ever: injected illegal drugs, used crack cocaine, lived in a homeless shelter  or been in jail or prison?   No  6. Have you ever been exposed to anyone with infectious tuberculosis?  No   Tuberculosis Symptom Questionnaire  Do you currently have any of the following symptoms?  1. Unexplained cough lasting more than 3 weeks? No  2. Unexplained fever lasting more than 3 weeks. No   3. Night Sweats (sweating that leaves the bedclothes and sheets wet)   No  4. Shortness of Breath No  5. Chest Pain No  6. Unintentional weight loss  No  7. Unexplained fatigue (very tired for no reason) No  

## 2013-04-27 ENCOUNTER — Ambulatory Visit (HOSPITAL_COMMUNITY): Payer: Self-pay | Admitting: Psychiatry

## 2013-05-11 ENCOUNTER — Ambulatory Visit (HOSPITAL_COMMUNITY): Payer: Self-pay | Admitting: Psychiatry

## 2013-05-22 ENCOUNTER — Ambulatory Visit (HOSPITAL_COMMUNITY): Payer: Self-pay | Admitting: Psychiatry

## 2013-06-19 HISTORY — PX: WISDOM TOOTH EXTRACTION: SHX21

## 2013-06-28 ENCOUNTER — Encounter (HOSPITAL_COMMUNITY): Payer: Self-pay | Admitting: Psychiatry

## 2013-06-28 ENCOUNTER — Ambulatory Visit (INDEPENDENT_AMBULATORY_CARE_PROVIDER_SITE_OTHER): Payer: Federal, State, Local not specified - PPO | Admitting: Psychiatry

## 2013-06-28 VITALS — BP 122/80 | Ht 60.5 in | Wt 188.6 lb

## 2013-06-28 DIAGNOSIS — F411 Generalized anxiety disorder: Secondary | ICD-10-CM

## 2013-06-28 DIAGNOSIS — F329 Major depressive disorder, single episode, unspecified: Secondary | ICD-10-CM

## 2013-06-28 MED ORDER — BUSPIRONE HCL 10 MG PO TABS: 10.0000 mg | ORAL_TABLET | Freq: Two times a day (BID) | ORAL | Status: DC

## 2013-06-28 MED ORDER — BUPROPION HCL ER (XL) 150 MG PO TB24: 150.0000 mg | ORAL_TABLET | ORAL | Status: DC

## 2013-06-28 NOTE — Progress Notes (Signed)
Patient ID: Yolanda Callahan, female   DOB: 07/25/1990, 22 y.o.   MRN: [id number]   [facility] Follow-up Outpatient Visit  Yolanda Callahan [birth year]     Subjective: Patient is a 22 year old female diagnosed with major depressive disorder, generalized anxiety disorder who presents today for a followup visit.   I doing well with my depression and anxiety.On a scale of 0-10, with 0 being no symptoms and 10 being the worst my anxiety is a 1/10 and my depression is also a 1/10. Patient states that she currently does not have any stressors that she is living at home, adds that her parents are supportive and reports that she's not working too much and is focusing on school. Patient denies any complaints, any side effects of the medication, any safety issues at this visit   Active Ambulatory Problems    Diagnosis Date Noted  . GAD (generalized anxiety disorder) 07/23/2011  . CIN I (cervical intraepithelial neoplasia I)   . MDD (major depressive disorder) 10/27/2012   Resolved Ambulatory Problems    Diagnosis Date Noted  . No Resolved Ambulatory Problems   Past Medical History  Diagnosis Date  . Asthma   . Seasonal allergies   . Polycystic ovarian disease   . Anxiety   . Depression     Current outpatient prescriptions:albuterol (PROVENTIL HFA;VENTOLIN HFA) 108 (90 BASE) MCG/ACT inhaler, Inhale 2 puffs into the lungs every 4 (four) hours as needed for wheezing., Disp: 1 Inhaler, Rfl: 2;  buPROPion (WELLBUTRIN XL) 150 MG 24 hr tablet, Take 1 tablet (150 mg total) by mouth every morning., Disp: 30 tablet, Rfl: 2;  busPIRone (BUSPAR) 10 MG tablet, Take 1 tablet (10 mg total) by mouth 2 (two) times daily., Disp: 60 tablet, Rfl: 3 desogestrel-ethinyl estradiol (VIORELE) 0.15-0.02/0.01 MG (21/5) tablet, TAKE 1 TABLET BY MOUTH DAILY., Disp: 28 tablet, Rfl: 11;  mometasone (NASONEX) 50 MCG/ACT nasal spray, Place 2 sprays into the nose daily., Disp: 17 g, Rfl: 12;  montelukast (SINGULAIR)  10 MG tablet, Take 1 tablet (10 mg total) by mouth at bedtime., Disp: 30 tablet, Rfl: 11;  Multiple Vitamin (MULTIVITAMIN) tablet, Take 1 tablet by mouth daily., Disp: , Rfl:  Spacer/Aero-Holding Chambers (E-Z SPACER) inhaler, Use as instructed, Disp: 1 each, Rfl: 2    Review of Systems  Constitutional: Negative.  Negative for fever and weight loss.  HENT: Negative.  Negative for congestion and sore throat.   Cardiovascular: Negative.  Negative for chest pain and palpitations.  Neurological: Negative.  Negative for dizziness, focal weakness, seizures, loss of consciousness, weakness and headaches.  Psychiatric/Behavioral: Negative for depression, suicidal ideas, hallucinations, memory loss and substance abuse. The patient is not nervous/anxious and does not have insomnia.    Blood pressure 122/80, height 5' 0.5" (1.537 m), weight 188 lb 9.6 oz (85.548 kg).  Mental Status Examination  Appearance: Casually dressed Alert: Yes Attention: fair  Cooperative: Yes Eye Contact: Fair Speech: Normal in volume, rate, tone, spontaneous  Psychomotor Activity: Normal Memory/Concentration: OK Oriented: person, place and situation Mood: Euthymic Affect: Full Range Thought Processes and Associations: Goal Directed Fund of Knowledge: Good Thought Content: Suicidal ideation, Homicidal ideation, Auditory hallucinations, Visual hallucinations, Delusions and Paranoia, none reported Insight: Fair Judgement: Fair Language: Fair  Diagnosis: Generalized anxiety disorder, major depressive disorder  Treatment Plan Continue Wellbutrin XL 150 MG one daliy for depression and to help with focus.  Continue BuSpar to 10 mg one pill twice daily for anxiety Continue melatonin 3 mg one or 2  pills at bedtime when necessary for sleep. Discussed diet and exercise again in length with the patient at this visit. Call when necessary Followup in 3 months   Nelly Rout, MD

## 2013-07-20 DIAGNOSIS — Z202 Contact with and (suspected) exposure to infections with a predominantly sexual mode of transmission: Secondary | ICD-10-CM

## 2013-07-20 HISTORY — DX: Contact with and (suspected) exposure to infections with a predominantly sexual mode of transmission: Z20.2

## 2013-10-07 ENCOUNTER — Ambulatory Visit (INDEPENDENT_AMBULATORY_CARE_PROVIDER_SITE_OTHER): Payer: Federal, State, Local not specified - PPO | Admitting: Family Medicine

## 2013-10-07 VITALS — BP 131/81 | HR 86 | Temp 98.3°F | Resp 18 | Wt 174.0 lb

## 2013-10-07 DIAGNOSIS — J329 Chronic sinusitis, unspecified: Secondary | ICD-10-CM

## 2013-10-07 MED ORDER — AZITHROMYCIN 250 MG PO TABS: ORAL_TABLET | ORAL | Status: DC

## 2013-10-07 NOTE — Progress Notes (Signed)
This chart was scribed for Elvina Sidle, MD by Nicholos Johns, Medical Scribe. This patient's care was started at 3:20 PM  Patient ID: Yolanda Callahan MRN: [id number], DOB: 06-Dec-1990, 23 y.o. Date of Encounter: 10/07/2013, 3:20 PM  Primary Physician: No PCP Per Patient  Chief Complaint: sinus pressure, rhinorrhea, cough  HPI: 23 y.o. year old female with history below presents with sinus pressure, thick rhinorrhea, and mild cough onset 1 week ago. Reports she had a fever yesterday that has since resolved. Was taking Tylenol Cold Severe with no relief. 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 Denies nausea, vomiting   Past Medical History  Diagnosis Date   Asthma    Seasonal allergies    Polycystic ovarian disease    CIN I (cervical intraepithelial neoplasia I) 2012   Anxiety    Depression      Home Meds: Prior to Admission medications   Medication Sig Start Date End Date Taking? Authorizing Provider  albuterol (PROVENTIL HFA;VENTOLIN HFA) 108 (90 BASE) MCG/ACT inhaler Inhale 2 puffs into the lungs every 4 (four) hours as needed for wheezing. 11/15/12  Yes Ryan M Dunn, PA-C  buPROPion (WELLBUTRIN XL) 150 MG 24 hr tablet Take 1 tablet (150 mg total) by mouth every morning. 06/28/13 06/28/14 Yes Nelly Rout, MD  busPIRone (BUSPAR) 10 MG tablet Take 1 tablet (10 mg total) by mouth 2 (two) times daily. 06/28/13  Yes Nelly Rout, MD  desogestrel-ethinyl estradiol (VIORELE) 0.15-0.02/0.01 MG (21/5) tablet TAKE 1 TABLET BY MOUTH DAILY. 01/04/13  Yes Harrington Challenger, NP  mometasone (NASONEX) 50 MCG/ACT nasal spray Place 2 sprays into the nose daily. 11/15/12  Yes Ryan M Dunn, PA-C  montelukast (SINGULAIR) 10 MG tablet Take 1 tablet (10 mg total) by mouth at bedtime. 11/15/12  Yes Ryan M Dunn, PA-C  Multiple Vitamin (MULTIVITAMIN) tablet Take 1 tablet by mouth daily.   Yes Historical Provider, MD  Spacer/Aero-Holding Chambers (E-Z  SPACER) inhaler Use as instructed 11/15/12  Yes Sondra Barges, PA-C    Allergies:  Allergies  Allergen Reactions   Penicillins     History   Social History   Marital Status: Single    Spouse Name: N/A    Number of Children: N/A   Years of Education: N/A   Occupational History   Not on file.   Social History Main Topics   Smoking status: Former Smoker   Smokeless tobacco: Not on file   Alcohol Use: 1.5 oz/week    3 drink(s) per week   Drug Use: No   Sexual Activity: Yes    Copy: None   Other Topics Concern   Not on file   Social History Narrative   No narrative on file     Review of Systems: Constitutional: negative for chills, fever, night sweats, weight changes, or fatigue  HEENT: negative for vision changes, hearing loss, congestion, ST, epistaxis. Positive for sinus pressure and rhinorrhea. Cardiovascular: negative for chest pain or palpitations Respiratory: negative for hemoptysis, wheezing, shortness of breath. Positive for cough. Abdominal: negative for abdominal pain, nausea, vomiting, diarrhea, or constipation Dermatological: negative for rash Neurologic: negative for headache, dizziness, or syncope All other systems reviewed and are otherwise negative with the exception to those above and in the HPI.   Physical Exam: Blood pressure 131/81, pulse 86, temperature 98.3 F (36.8 C), temperature source Oral, resp. rate 18, weight 174 lb (78.926 kg), last menstrual period 09/17/2013, SpO2  98.00%., Body mass index is 33.41 kg/(m^2). General: Well developed, well nourished, in no acute distress. Head: Normocephalic, atraumatic, eyes without discharge, sclera non-icteric, nares are without discharge. Bilateral auditory canals clear, TM's are without perforation, pearly grey and translucent with reflective cone of light bilaterally. Oral cavity moist, posterior pharynx without exudate, erythema, peritonsillar abscess, or post nasal  drip.  Neck: Supple. No thyromegaly. Full ROM. No lymphadenopathy. Lungs: Clear bilaterally to auscultation without wheezes, rales, or rhonchi. Breathing is unlabored. Heart: RRR with S1 S2. No murmurs, rubs, or gallops appreciated. Abdomen: Soft, non-tender, non-distended with normoactive bowel sounds. No hepatomegaly. No rebound/guarding. No obvious abdominal masses. Msk:  Strength and tone normal for age. Extremities/Skin: Warm and dry. No clubbing or cyanosis. No edema. No rashes or suspicious lesions. Neuro: Alert and oriented X 3. Moves all extremities spontaneously. Gait is normal. CNII-XII grossly in tact. Psych:  Responds to questions appropriately with a normal affect.    ASSESSMENT AND PLAN:  23 y.o. year old female with Unspecified sinusitis (chronic) - Plan: azithromycin (ZITHROMAX Z-PAK) 250 MG tablet      Signed, Elvina SidleKurt Lauenstein, MD 10/07/2013 3:20 PM

## 2013-10-07 NOTE — Patient Instructions (Signed)

## 2013-10-31 ENCOUNTER — Encounter (HOSPITAL_COMMUNITY): Payer: Self-pay | Admitting: Psychiatry

## 2013-10-31 ENCOUNTER — Ambulatory Visit (INDEPENDENT_AMBULATORY_CARE_PROVIDER_SITE_OTHER): Payer: Federal, State, Local not specified - PPO | Admitting: Psychiatry

## 2013-10-31 VITALS — BP 143/96 | HR 86 | Ht 60.0 in | Wt 175.2 lb

## 2013-10-31 DIAGNOSIS — F329 Major depressive disorder, single episode, unspecified: Secondary | ICD-10-CM

## 2013-10-31 DIAGNOSIS — F411 Generalized anxiety disorder: Secondary | ICD-10-CM

## 2013-10-31 MED ORDER — BUPROPION HCL ER (XL) 150 MG PO TB24: 150.0000 mg | ORAL_TABLET | ORAL | Status: DC

## 2013-10-31 MED ORDER — BUSPIRONE HCL 10 MG PO TABS: 10.0000 mg | ORAL_TABLET | Freq: Two times a day (BID) | ORAL | Status: DC

## 2013-10-31 NOTE — Progress Notes (Addendum)
Patient ID: Yolanda Callahan Cinque, female   DOB: [birth year], 23 y.o.   MRN: [id number]   [facility]  Yolanda Callahan Boettner [birth year]     Subjective: Patient is a 23 year-old female diagnosed with major depressive disorder, generalized anxiety disorder who presents today for a followup visit.  Patient states that  she is doing well. She states that her grades are good. She is still living at home, adds that her parents are supportive and reports that she's not working too much.  She states that she is doing well with her depression and anxiety.On a scale of 0-10, with 0 being no symptoms and 10 being the worst my anxiety is a 1/10 and my depression is also a 1/10.  Patient denies any complaints, any side effects of the medication, any safety issues at this visit   Active Ambulatory Problems    Diagnosis Date Noted  . GAD (generalized anxiety disorder) 07/23/2011  . CIN I (cervical intraepithelial neoplasia I)   . MDD (major depressive disorder) 10/27/2012   Resolved Ambulatory Problems    Diagnosis Date Noted  . No Resolved Ambulatory Problems   Past Medical History  Diagnosis Date  . Asthma   . Seasonal allergies   . Polycystic ovarian disease   . Anxiety   . Depression   Family History: there is no family psychiatric history  Social history: Patient is in college and is living [place]  Current outpatient prescriptions:albuterol (PROVENTIL HFA;VENTOLIN HFA) 108 (90 BASE) MCG/ACT inhaler, Inhale 2 puffs into the lungs every 4 (four) hours as needed for wheezing., Disp: 1 Inhaler, Rfl: 2;  azithromycin (ZITHROMAX Z-PAK) 250 MG tablet, Take as directed on pack, Disp: 6 tablet, Rfl: 0;  buPROPion (WELLBUTRIN XL) 150 MG 24 hr tablet, Take 1 tablet (150 mg total) by mouth every morning., Disp: 30 tablet, Rfl: 3 busPIRone (BUSPAR) 10 MG tablet, Take 1 tablet (10 mg total) by mouth 2 (two) times daily., Disp: 60 tablet,  Rfl: 3;  desogestrel-ethinyl estradiol (VIORELE) 0.15-0.02/0.01 MG (21/5) tablet, TAKE 1 TABLET BY MOUTH DAILY., Disp: 28 tablet, Rfl: 11;  mometasone (NASONEX) 50 MCG/ACT nasal spray, Place 2 sprays into the nose daily., Disp: 17 g, Rfl: 12 montelukast (SINGULAIR) 10 MG tablet, Take 1 tablet (10 mg total) by mouth at bedtime., Disp: 30 tablet, Rfl: 11;  Multiple Vitamin (MULTIVITAMIN) tablet, Take 1 tablet by mouth daily., Disp: , Rfl: ;  Spacer/Aero-Holding Chambers (E-Z SPACER) inhaler, Use as instructed, Disp: 1 each, Rfl: 2    Review of Systems  Constitutional: Negative.  Negative for fever and weight loss.  HENT: Negative.  Negative for congestion and sore throat.   Cardiovascular: Negative.  Negative for chest pain and palpitations.  Neurological: Negative.  Negative for dizziness, focal weakness, seizures, loss of consciousness, weakness and headaches.  Psychiatric/Behavioral: Negative for depression, suicidal ideas, hallucinations, memory loss and substance abuse. The patient is not nervous/anxious and does not have insomnia.    General Appearance: alert, oriented, no acute distress and well nourished  Musculoskeletal: Strength & Muscle Tone: within normal limits Gait & Station: normal Patient leans: N/A  Blood pressure 143/96, pulse 86, height 5' (1.524 m), weight 175 lb 3.2 oz (79.47 kg), last menstrual period 09/17/2013.  Mental Status Examination  Appearance: Casually dressed Alert: Yes Attention: fair  Cooperative: Yes Eye Contact: Fair Speech: Normal in volume, rate, tone, spontaneous  Psychomotor Activity: Normal Memory/Concentration: OK Oriented: person, place and situation Mood:  Euthymic Affect: Full Range Thought Processes and Associations: Goal Directed Fund of Knowledge: Good Thought Content: Suicidal ideation, Homicidal ideation, Auditory hallucinations, Visual hallucinations, Delusions and Paranoia, none reported Insight: Fair Judgement: Fair Language:  Fair  Diagnosis: Generalized anxiety disorder, major depressive disorder  Treatment Plan Continue Wellbutrin XL 150 MG one daliy for depression and to help with focus.  Continue BuSpar to 10 mg one pill twice daily for anxiety Continue melatonin 3 mg one or 2 pills at bedtime when necessary for sleep. Discussed diet and exercise again in length with the patient at this visit. Call when necessary Followup in 3 months   Nelly RoutKUMAR,Jaylise Peek, MD

## 2013-12-12 ENCOUNTER — Encounter (HOSPITAL_COMMUNITY): Payer: Self-pay | Admitting: Psychiatry

## 2013-12-12 ENCOUNTER — Ambulatory Visit (INDEPENDENT_AMBULATORY_CARE_PROVIDER_SITE_OTHER): Payer: Federal, State, Local not specified - PPO | Admitting: Psychiatry

## 2013-12-12 VITALS — BP 124/78 | Ht 60.0 in | Wt 178.0 lb

## 2013-12-12 DIAGNOSIS — F329 Major depressive disorder, single episode, unspecified: Secondary | ICD-10-CM

## 2013-12-12 DIAGNOSIS — F411 Generalized anxiety disorder: Secondary | ICD-10-CM

## 2013-12-12 MED ORDER — BUSPIRONE HCL 10 MG PO TABS: 10.0000 mg | ORAL_TABLET | Freq: Two times a day (BID) | ORAL | Status: DC

## 2013-12-12 MED ORDER — BUPROPION HCL ER (XL) 300 MG PO TB24: 300.0000 mg | ORAL_TABLET | ORAL | Status: DC

## 2013-12-12 NOTE — Progress Notes (Signed)
Patient ID: Yolanda Callahan, female   DOB: [birth year], 23 y.o.   MRN: [id number]   [facility] Follow-up Outpatient Visit  Yolanda Callahan [birth year]     Subjective: Patient is a 23 year-old female diagnosed with major depressive disorder, generalized anxiety disorder who presents today for a followup visit.  Patient states that  she is struggling with depression again, adds that her father was recently diagnosed with bipolar disorder and that he also has substance abuse issues. Mom states that they've been a lot of stresses recently which include patient's father being hospitalized, patient's grandmother passing away and dad's addiction issues. Patient states that she feels sad at times but denies having suicidal thoughts. On being questioned if he had any symptoms of mania, patient denies this. She adds that she knows she needs to attend a group for families dealing with a lot of ones having addiction issues. Mom states that she goes to one and would like to take the patient with her. Patient states that she is willing .   On a scale of 0-10, with 0 being no symptoms and 10 being the worst the patient rates her anxiety as a 1/10 and her depression as a 5/10. Patient currently denies any relieving factors and states that her father's recent mental health issues have been a precipitating factor.  Patient denies any other complaints, any side effects of the medication, any safety issues at this visit   Active Ambulatory Problems    Diagnosis Date Noted  . GAD (generalized anxiety disorder) 07/23/2011  . CIN I (cervical intraepithelial neoplasia I)   . MDD (major depressive disorder) 10/27/2012   Resolved Ambulatory Problems    Diagnosis Date Noted  . No Resolved Ambulatory Problems   Past Medical History  Diagnosis Date  . Asthma   . Seasonal allergies   . Polycystic ovarian disease   . Anxiety   . Depression   Family History: Father has substance abuse and has recently  been diagnosed with Bipolar Disorder  Social history: Patient is in college and is [place]  Current outpatient prescriptions:albuterol (PROVENTIL HFA;VENTOLIN HFA) 108 (90 BASE) MCG/ACT inhaler, Inhale 2 puffs into the lungs every 4 (four) hours as needed for wheezing., Disp: 1 Inhaler, Rfl: 2;  azithromycin (ZITHROMAX Z-PAK) 250 MG tablet, Take as directed on pack, Disp: 6 tablet, Rfl: 0;  buPROPion (WELLBUTRIN XL) 300 MG 24 hr tablet, Take 1 tablet (300 mg total) by mouth every morning., Disp: 30 tablet, Rfl: 3 busPIRone (BUSPAR) 10 MG tablet, Take 1 tablet (10 mg total) by mouth 2 (two) times daily., Disp: 60 tablet, Rfl: 3;  desogestrel-ethinyl estradiol (VIORELE) 0.15-0.02/0.01 MG (21/5) tablet, TAKE 1 TABLET BY MOUTH DAILY., Disp: 28 tablet, Rfl: 11;  mometasone (NASONEX) 50 MCG/ACT nasal spray, Place 2 sprays into the nose daily., Disp: 17 g, Rfl: 12 montelukast (SINGULAIR) 10 MG tablet, Take 1 tablet (10 mg total) by mouth at bedtime., Disp: 30 tablet, Rfl: 11;  Multiple Vitamin (MULTIVITAMIN) tablet, Take 1 tablet by mouth daily., Disp: , Rfl: ;  Spacer/Aero-Holding Chambers (E-Z SPACER) inhaler, Use as instructed, Disp: 1 each, Rfl: 2    Review of Systems  Constitutional: Negative.  Negative for fever and weight loss.  HENT: Negative.  Negative for congestion and sore throat.   Cardiovascular: Negative.  Negative for chest pain and palpitations.  Neurological: Negative.  Negative for dizziness, focal weakness, seizures, loss of consciousness, weakness and headaches.  Psychiatric/Behavioral: Negative  for depression, suicidal ideas, hallucinations, memory loss and substance abuse. The patient is not nervous/anxious and does not have insomnia.    General Appearance: alert, oriented, no acute distress and well nourished  Musculoskeletal: Strength & Muscle Tone: within normal limits Gait & Station: normal Patient leans: N/A  Blood pressure  124/78, height 5' (1.524 m), weight 178 lb (80.74 kg).  Mental Status Examination  Appearance: Casually dressed Alert: Yes Attention: fair  Cooperative: Yes Eye Contact: Fair Speech: Normal in volume, rate, tone, spontaneous  Psychomotor Activity: Normal Memory/Concentration: OK Oriented: person, place and situation Mood: Euthymic Affect: Full Range Thought Processes and Associations: Goal Directed Fund of Knowledge: Good Thought Content: Suicidal ideation, Homicidal ideation, Auditory hallucinations, Visual hallucinations, Delusions and Paranoia, none reported Insight: Fair to poor Judgement: Fair Language: Fair  Diagnosis: Generalized anxiety disorder, major depressive disorder  Treatment Plan:  Major depressive disorder: Increase Wellbutrin XL to 300 MG one daliy for depression and to help with focus. Generalized anxiety disorder:  Continue BuSpar to 10 mg one pill twice daily for anxiety Insomnia: Continue melatonin 3 mg one or 2 pills at bedtime when necessary for sleep. Rule out mood disorder NOS: Discussed all the symptoms of bipolar illness in length with patient and mom, patient denies any manic symptoms, a mood rating form was given to patient at this visit to be complete it. Patient also denies any addiction problems  Discussed diet and exercise again in length with the patient at this visit. Call when necessary Followup in 6 weeks 50% of this visit was spent in discussing dad's recent diagnosis of bipolar illness, substance abuse issues, the symptoms of bipolar disorder versus depression. Start time 3:12 PM Stop time 3:45 PM Nelly Rout, MD

## 2014-01-07 ENCOUNTER — Other Ambulatory Visit: Payer: Self-pay | Admitting: Women's Health

## 2014-01-17 ENCOUNTER — Encounter: Payer: Self-pay | Admitting: Gynecology

## 2014-01-17 ENCOUNTER — Ambulatory Visit (INDEPENDENT_AMBULATORY_CARE_PROVIDER_SITE_OTHER): Payer: Federal, State, Local not specified - PPO | Admitting: Gynecology

## 2014-01-17 ENCOUNTER — Ambulatory Visit: Payer: Federal, State, Local not specified - PPO

## 2014-01-17 VITALS — BP 120/84 | HR 82 | Resp 16 | Ht 60.75 in | Wt 179.0 lb

## 2014-01-17 DIAGNOSIS — Z8619 Personal history of other infectious and parasitic diseases: Secondary | ICD-10-CM | POA: Insufficient documentation

## 2014-01-17 DIAGNOSIS — B9689 Other specified bacterial agents as the cause of diseases classified elsewhere: Secondary | ICD-10-CM

## 2014-01-17 DIAGNOSIS — Z0000 Encounter for general adult medical examination without abnormal findings: Secondary | ICD-10-CM

## 2014-01-17 DIAGNOSIS — Z124 Encounter for screening for malignant neoplasm of cervix: Secondary | ICD-10-CM

## 2014-01-17 DIAGNOSIS — R102 Pelvic and perineal pain: Secondary | ICD-10-CM

## 2014-01-17 DIAGNOSIS — Z113 Encounter for screening for infections with a predominantly sexual mode of transmission: Secondary | ICD-10-CM

## 2014-01-17 DIAGNOSIS — A499 Bacterial infection, unspecified: Secondary | ICD-10-CM

## 2014-01-17 DIAGNOSIS — N760 Acute vaginitis: Secondary | ICD-10-CM

## 2014-01-17 DIAGNOSIS — Z3041 Encounter for surveillance of contraceptive pills: Secondary | ICD-10-CM

## 2014-01-17 DIAGNOSIS — N949 Unspecified condition associated with female genital organs and menstrual cycle: Secondary | ICD-10-CM

## 2014-01-17 DIAGNOSIS — N839 Noninflammatory disorder of ovary, fallopian tube and broad ligament, unspecified: Secondary | ICD-10-CM

## 2014-01-17 DIAGNOSIS — N838 Other noninflammatory disorders of ovary, fallopian tube and broad ligament: Secondary | ICD-10-CM

## 2014-01-17 DIAGNOSIS — Z01419 Encounter for gynecological examination (general) (routine) without abnormal findings: Secondary | ICD-10-CM

## 2014-01-17 LAB — POCT URINALYSIS DIPSTICK
Urobilinogen, UA: NEGATIVE
pH, UA: 5

## 2014-01-17 LAB — POCT WET PREP (WET MOUNT): Clue Cells Wet Prep Whiff POC: POSITIVE

## 2014-01-17 LAB — HEMOGLOBIN, FINGERSTICK: Hemoglobin, fingerstick: 12.5 g/dL (ref 12.0–16.0)

## 2014-01-17 MED ORDER — DESOGESTREL-ETHINYL ESTRADIOL 0.15-0.02/0.01 MG (21/5) PO TABS: 1.0000 | ORAL_TABLET | Freq: Every day | ORAL | Status: DC

## 2014-01-17 MED ORDER — TINIDAZOLE 500 MG PO TABS: ORAL_TABLET | ORAL | Status: DC

## 2014-01-17 NOTE — Progress Notes (Addendum)
23 y.o. Single African American female   G0P0 here for annual exam. Pt is currently sexually active.  She reports  using condoms on a regular basis.  First sexual activity at 23 years old, 4325  number of lifetime partners. 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  Pt had CIN I in 2012, colpo done, f/u paps have been normal.  Pt reports on ocp since 18 for PCOS and pain.  Pt did well until Feb 2015 when she started getting cramping every day, not only with cycle. Current partner 3462m, condoms with current partner but not with prior.  Pt states had not been sexually active with partner from whom she got CTM.    Patient's last menstrual period was 01/07/2014.          Sexually active: Yes.    The current method of family planning is OCP (estrogen/progesterone).    Exercising: Yes.    cardio (treadmill) 2-3x/wk Last pap: 01/04/12 LSIL  Had colpo bx-2012 Alcohol: 2-3 drinks/wk Tobacco: no Drugs: no Gardisil: no started 01/04/13 #1 only, completed:  Hgb: 12.5 ; Urine: Leuks 1  Health Maintenance  Topic Date Due  . Chlamydia Screening  10/08/2007  . Tetanus/tdap  10/08/2011  . Influenza Vaccine  02/17/2014  . Pap Smear  01/04/2015    Family History  Problem Relation Age of Onset  . Anxiety disorder Father   . Hypertension Father   . Anxiety disorder Paternal Grandmother   . Depression Paternal Grandmother   . Diabetes Paternal Grandmother   . Hypertension Mother   . Heart disease Maternal Grandmother   . Diabetes Paternal Grandfather   . Hypertension Maternal Grandfather   . Endometriosis Mother   . Fibroids Maternal Grandmother   . Osteoporosis Maternal Grandmother     Patient Active Problem List   Diagnosis Date Noted  . MDD (major depressive disorder) 10/27/2012  . CIN I (cervical intraepithelial neoplasia I)   . GAD (generalized anxiety disorder) 07/23/2011    Past Medical History  Diagnosis Date  . Asthma   . Seasonal allergies   . Polycystic ovarian disease   . CIN I  (cervical intraepithelial neoplasia I) 2012  . Anxiety   . Depression   . Chlamydia contact, treated 07/2013    Past Surgical History  Procedure Laterality Date  . Wisdom tooth extraction  06/2013  . Colposcopy  2012  . Mouth surgery  2008    Allergies: Penicillins  Current Outpatient Prescriptions  Medication Sig Dispense Refill  . buPROPion (WELLBUTRIN XL) 300 MG 24 hr tablet Take 1 tablet (300 mg total) by mouth every morning.  30 tablet  3  . busPIRone (BUSPAR) 10 MG tablet Take 1 tablet (10 mg total) by mouth 2 (two) times daily.  60 tablet  3  . montelukast (SINGULAIR) 10 MG tablet Take 10 mg by mouth as needed.      . Multiple Vitamin (MULTIVITAMIN) tablet Take 1 tablet by mouth daily.      Marland Kitchen. Spacer/Aero-Holding Chambers (E-Z SPACER) inhaler as needed. Use as instructed      . VIORELE 0.15-0.02/0.01 MG (21/5) tablet TAKE 1 TABLET BY MOUTH DAILY.  28 tablet  0  . albuterol (PROVENTIL HFA;VENTOLIN HFA) 108 (90 BASE) MCG/ACT inhaler Inhale 2 puffs into the lungs every 4 (four) hours as needed for wheezing.  1 Inhaler  2   No current facility-administered medications for this visit.    ROS: Pertinent items are noted in HPI.  Exam:  BP 120/84  Pulse 82  Resp 16  Ht 5' 0.75" (1.543 m)  Wt 179 lb (81.194 kg)  BMI 34.10 kg/m2  LMP 01/07/2014 Weight change: @WEIGHTCHANGE @ Last 3 height recordings:  Ht Readings from Last 3 Encounters:  01/17/14 5' 0.75" (1.543 m)  12/12/13 5' (1.524 m)  10/31/13 5' (1.524 m)   General appearance: alert, cooperative and appears stated age Head: Normocephalic, without obvious abnormality, atraumatic Neck: no adenopathy, no carotid bruit, no JVD, supple, symmetrical, trachea midline and thyroid not enlarged, symmetric, no tenderness/mass/nodules Lungs: clear to auscultation bilaterally Breasts: normal appearance, no masses or tenderness Heart: regular rate and rhythm, S1, S2 normal, no murmur, click, rub or gallop Abdomen: soft,  non-tender; bowel sounds normal; no masses,  no organomegaly Extremities: extremities normal, atraumatic, no cyanosis or edema Skin: Skin color, texture, turgor normal. No rashes or lesions Lymph nodes: Cervical, supraclavicular, and axillary nodes normal. no inguinal nodes palpated Neurologic: Grossly normal   Pelvic: External genitalia:  no lesions              Urethra: normal appearing urethra with no masses, tenderness or lesions              Bartholins and Skenes: normal                 Vagina: normal appearing vagina with normal color, no lesions, scant white discharge near cervix              Cervix: normal appearance and cervical motion tenderness, minimal              Pap taken: Yes.          Bimanual Exam:  Uterus:  uterus is normal size, shape, consistency and nontender                                      Adnexa:    mass present right, size 2 cm                                      Rectovaginal: Confirms                                      Anus:  normal sphincter tone, no lesions      P:  1. Routine gynecological examination counseled on breast self exam, STD prevention, use and side effects of OCP's return annually or prn Discussed STD prevention, regular condom use. Discussed HPV vaccine risks and benefits, pt will rto for next vaccine  2. Laboratory examination ordered as part of a routine general medical examination  - POCT Urinalysis Dipstick - Hemoglobin, fingerstick  3. Screening for cervical cancer Guideline changes reviewed - PAP with Reflex to HPV (IPS)  4. H/O chlamydia infection No test of cure, multiple partners since infection - N. gonorrhoea and Chlamydia by PCR (IPS)  5. Pelvic pain in female  - POCT Wet Prep [employer]([place])  6. Encounter for surveillance of contraceptive pills Condom use stressed - desogestrel-ethinyl estradiol (VIORELE) 0.15-0.02/0.01 MG (21/5) tablet; Take 1 tablet by mouth daily.  Dispense: 3 Package; Refill: 3  7. Ovarian  mass, right Family history of endometriosis - US Transvaginal Non-OB; Future  8. Bacterial vaginal infection  -  tinidazole (TINDAMAX) 500 MG tablet; 4 tablets a day for 2d  Dispense: 8 tablet; Refill: 0  9. Screen for STD (sexually transmitted disease) Second CTM infection, last STD screening 2013, recommend repeating, increase in local syphilis reviewed - HIV antibody - RPR - Hepatitis C antibody    An After Visit Summary was printed and given to the patient.

## 2014-01-18 ENCOUNTER — Ambulatory Visit (INDEPENDENT_AMBULATORY_CARE_PROVIDER_SITE_OTHER): Payer: Federal, State, Local not specified - PPO | Admitting: *Deleted

## 2014-01-18 VITALS — BP 110/88 | Resp 18 | Ht 60.75 in | Wt 180.0 lb

## 2014-01-18 DIAGNOSIS — Z23 Encounter for immunization: Secondary | ICD-10-CM

## 2014-01-18 LAB — RPR

## 2014-01-18 LAB — HEPATITIS C ANTIBODY: HCV Ab: NEGATIVE

## 2014-01-18 LAB — HIV ANTIBODY (ROUTINE TESTING W REFLEX): HIV 1&2 Ab, 4th Generation: NONREACTIVE

## 2014-01-18 NOTE — Progress Notes (Signed)
Patient is here for 2nd  Gardasil  Patient tolerated Injection well  Aware that 3rd Gardasil is due in 4 months  Routed to provider for review, encounter closed.

## 2014-01-22 LAB — IPS N GONORRHOEA AND CHLAMYDIA BY PCR

## 2014-01-23 ENCOUNTER — Telehealth: Payer: Self-pay | Admitting: Gynecology

## 2014-01-23 ENCOUNTER — Other Ambulatory Visit: Payer: Self-pay | Admitting: Gynecology

## 2014-01-23 DIAGNOSIS — IMO0002 Reserved for concepts with insufficient information to code with codable children: Secondary | ICD-10-CM

## 2014-01-23 LAB — IPS PAP TEST WITH REFLEX TO HPV

## 2014-01-23 NOTE — Telephone Encounter (Signed)
Message copied by Joeseph AmorFAST, Akili Corsetti L on Tue Jan 23, 2014  2:15 PM ------      Message from: Douglass RiversLATHROP, Catharina Pica      Created: Tue Jan 23, 2014  1:43 PM       LGSIL, will need colpo, has had in past, [facility] order dropped ------

## 2014-01-23 NOTE — Telephone Encounter (Signed)
Left message for patient to call back. Need to go over benefits for colpo.PR $53.92

## 2014-01-23 NOTE — Telephone Encounter (Signed)
Dr. Farrel GobbleLathrop can you confirm? Patient is ordered for pelvic ultrasound and also now will need colposcopy. Schedule colposcopy first then pelvic ultrasound? Is there a time frame to schedule in between the procedures?

## 2014-01-23 NOTE — Telephone Encounter (Signed)
Pus is for right adnexal mass, her mother has endometriomas, but the colpo is for lgsil, i'd do coplo fisrt

## 2014-01-23 NOTE — Telephone Encounter (Signed)
Patient wants to know if she was still supposed to have a pus

## 2014-01-23 NOTE — Telephone Encounter (Addendum)
Patient notified of message from Dr. Farrel GobbleLathrop.  She is agreeable to scheduling colposcopy. Brief description of procedure given to patient.  Colposcopy pre-procedure instructions given. Advised 800 mg of Motrin with food one hour prior to appointment. Motrin=Advil=Ibuprofen Can take 800 mg (Can purchase over the counter, you will need four 200 mg pills). Make sure to eat a meal before appointment and drink plenty of fluids. Patient verbalized understanding and will call to reschedule if will be on menses or has any concerns regarding pregnancy, patient on Viorele currently and states LMP was 01/10/14 (she is unsure of exact date) but I reiterated to patient to call our office prior to appointment if develops any bleeding . Advised will need to cancel within 24 hours or will have $100.00 no show fee placed to account. Agreeable and verbalizes understanding of all of instructions and polices.    Type of birth control Viorele LMP 01/10/14 (patient unsure of last date) Notified of cancellation policy. Scheduled procedure room.   Martie Lee.   Sabrina, patient aware of need for colposcopy. Advised you would call with out of pocking information if more than a copay. Advised will need to schedule pelvic ultrasound after colposcopy completed. Can you link order to appointment after precert?

## 2014-02-06 ENCOUNTER — Ambulatory Visit (HOSPITAL_COMMUNITY): Payer: Self-pay | Admitting: Psychiatry

## 2014-02-07 ENCOUNTER — Ambulatory Visit (INDEPENDENT_AMBULATORY_CARE_PROVIDER_SITE_OTHER): Payer: Federal, State, Local not specified - PPO | Admitting: Gynecology

## 2014-02-07 ENCOUNTER — Encounter: Payer: Self-pay | Admitting: Gynecology

## 2014-02-07 VITALS — BP 100/62 | Resp 14 | Ht 60.75 in | Wt 183.0 lb

## 2014-02-07 DIAGNOSIS — N898 Other specified noninflammatory disorders of vagina: Secondary | ICD-10-CM

## 2014-02-07 DIAGNOSIS — IMO0002 Reserved for concepts with insufficient information to code with codable children: Secondary | ICD-10-CM

## 2014-02-07 DIAGNOSIS — R6889 Other general symptoms and signs: Secondary | ICD-10-CM

## 2014-02-07 LAB — POCT WET PREP (WET MOUNT): Clue Cells Wet Prep Whiff POC: NEGATIVE

## 2014-02-07 MED ORDER — FLUCONAZOLE 150 MG PO TABS: ORAL_TABLET | ORAL | Status: DC

## 2014-02-07 NOTE — Progress Notes (Addendum)
Patient ID: Yolanda MayersBria L Callahan, female   DOB: Mar 12, 1991, 23 y.o.   MRN: [id number]  No chief complaint on file.   HPI Yolanda Callahan is a 23 y.o. female. Pt has had several abnormal pap's including ASCUS and recently LGSIL, has twice been tested for HPV-HR and has been negative.  Pt has been having PAP since 23yo due to older  Recommendations.  HPI  Indications: Pap smear on July 2015 showed: low-grade squamous intraepithelial neoplasia (LGSIL - encompassing HPV,mild dysplasia,CIN I). Previous colposcopy: CIN 1 and in 2011, 2012 and 2013. Prior cervical treatment: no treatment and HR HPV reported negative.Procedure explained and patient's questions were invited and answered.  Consent form signed.    Role of HPV in genesis of SIL discussed with patient, and questions answered.     Past Medical History  Diagnosis Date  . Asthma   . Seasonal allergies   . Polycystic ovarian disease   . CIN I (cervical intraepithelial neoplasia I) 2012  . Anxiety   . Depression   . Chlamydia contact, treated 07/2013    treated 2014, 2015    Past Surgical History  Procedure Laterality Date  . Wisdom tooth extraction  06/2013  . Colposcopy  2012  . Mouth surgery  2008    Family History  Problem Relation Age of Onset  . Anxiety disorder Father   . Hypertension Father   . Anxiety disorder Paternal Grandmother   . Depression Paternal Grandmother   . Diabetes Paternal Grandmother   . Hypertension Mother   . Heart disease Maternal Grandmother   . Diabetes Paternal Grandfather   . Hypertension Maternal Grandfather   . Endometriosis Mother   . Fibroids Maternal Grandmother   . Osteoporosis Maternal Grandmother     Social History History  Substance Use Topics  . Smoking status: Former Games developermoker  . Smokeless tobacco: Not on file  . Alcohol Use: 1.5 oz/week    3 drink(s) per week    Allergies  Allergen Reactions  . Penicillins Hives    Current Outpatient Prescriptions  Medication Sig  Dispense Refill  . albuterol (PROVENTIL HFA;VENTOLIN HFA) 108 (90 BASE) MCG/ACT inhaler Inhale 2 puffs into the lungs every 4 (four) hours as needed for wheezing.  1 Inhaler  2  . buPROPion (WELLBUTRIN XL) 300 MG 24 hr tablet Take 1 tablet (300 mg total) by mouth every morning.  30 tablet  3  . busPIRone (BUSPAR) 10 MG tablet Take 1 tablet (10 mg total) by mouth 2 (two) times daily.  60 tablet  3  . desogestrel-ethinyl estradiol (VIORELE) 0.15-0.02/0.01 MG (21/5) tablet Take 1 tablet by mouth daily.  3 Package  3  . montelukast (SINGULAIR) 10 MG tablet Take 10 mg by mouth as needed.      . Multiple Vitamin (MULTIVITAMIN) tablet Take 1 tablet by mouth daily.      Marland Kitchen. Spacer/Aero-Holding Chambers (E-Z SPACER) inhaler as needed. Use as instructed      . tinidazole (TINDAMAX) 500 MG tablet 4 tablets a day for 2d  8 tablet  0   No current facility-administered medications for this visit.    Review of Systems Review of Systems  Last menstrual period 01/07/2014.  Physical Exam Physical Exam  Nursing note and vitals reviewed. Constitutional: She is oriented to person, place, and time. She appears well-developed and well-nourished.  Genitourinary: There is tenderness (minimal) around the vagina. Vaginal discharge (copiuos white, thick discharge) found.    Neurological: She is alert and oriented to person,  place, and time.    Data Reviewed Prior pap's and bx  Assessment    Procedure Details  The risks and benefits of the procedure and Written informed consent obtained.  Marland KitchenSpeculum inserted atraumatically and cervix visualized.  3% acetic acid applied.  Cervix examined using 3.75 and 7.5 and 15   X magnification and green filter.    Gross appearance:normal  Squamocolumnar junction seen in entirety: yes   no mosaicism, no abnormal vasculature, acetowhite lesion(s) noted at 2 and 6 o'clock and punctation noted.  cervix swabbed with Lugol's solution, endocervical speculum placed, SCJ  visualized - lesion at 2 and 6 o'clock, endocervical lesion noted at 6 o'clock, endocervical curettage performed, cervical biopsies taken at 2,6,11 o'clock, specimen labelled and sent to pathology and hemostasis achieved with Monsel's solution  Extent of lesion entirely seen: yes    Specimens: 6,2,11,ECC  Complications: none.     Plan    Specimens labelled and sent to Pathology. Triage based on results WP+ yeast-diflucan given      Ranson Belluomini H 02/07/2014, 10:44 AM    Patient tolerated procedure well.    Post biopsy instructions and AVS given to patient.    A. CERVIX, "6:00", BIOPSY: -LOW GRADE SQUAMOUS INTRAEPITHELIAL LESION (MILD DYSPLASIA). -NEGATIVE FOR HIGH-GRADE DYSPLASIA OR MALIGNANCY. B. CERVIX, "2:00", BIOPSY: -LOW GRADE SQUAMOUS INTRAEPITHELIAL LESION (MILD DYSPLASIA). -NEGATIVE FOR HIGH-GRADE DYSPLASIA OR MALIGNANCY. -ACUTE AND CHRONIC CERVICITIS. C. CERVIX, "11:00", BIOPSY: -LOW GRADE SQUAMOUS INTRAEPITHELIAL LESION (MILD DYSPLASIA). -NEGATIVE FOR HIGH-GRADE DYSPLASIA OR MALIGNANCY. -ACUTE AND CHRONIC CERVICITIS. D. CERVIX, ENDOCERVICAL CURETTAGE: -FRAGMENTS OF BENIGN CERVICAL GLANDULAR EPITHELIUM WITH ACUTE AND CHRONIC INFLAMMATION. -NEGATIVE FOR DYSPLASIA OR MALIGNANCY.  Recall 8

## 2014-02-07 NOTE — Patient Instructions (Signed)

## 2014-02-09 ENCOUNTER — Telehealth: Payer: Self-pay

## 2014-02-09 LAB — IPS HPV ON A LIQUID BASED SPECIMEN

## 2014-02-09 LAB — IPS OTHER TISSUE BIOPSY

## 2014-02-09 NOTE — Telephone Encounter (Signed)
Message copied by Jannet AskewHINES, KAITLYN E on Fri Feb 09, 2014 12:17 PM ------      Message from: Douglass RiversLATHROP, TRACY      Created: Fri Feb 09, 2014 11:45 AM       Inform all bx CIN I, repeat pap next year, recall 8 ------

## 2014-02-09 NOTE — Telephone Encounter (Signed)
Spoke with patient. Advised of results as seen below. Patient agreeable and verbalizes understanding. Annual scheduled for 01/27/25. 08 recall entered.  Routing to provider for final review. Patient agreeable to disposition. Will close encounter

## 2014-02-09 NOTE — Telephone Encounter (Signed)
Left message to call Kaitlyn at [phone]. 

## 2014-02-11 ENCOUNTER — Other Ambulatory Visit: Payer: Self-pay | Admitting: Women's Health

## 2014-02-12 ENCOUNTER — Other Ambulatory Visit: Payer: Self-pay | Admitting: Gynecology

## 2014-02-12 NOTE — Telephone Encounter (Signed)
Pt was given new rx on 01/17/14 #3 packs, 3 rfs Encounter closed

## 2014-02-15 ENCOUNTER — Other Ambulatory Visit: Payer: Self-pay | Admitting: Women's Health

## 2014-02-15 NOTE — Telephone Encounter (Signed)
Spoke with patient. Advised that per benefit quote received, she will be responsible for $54.13 when she comes in for PUS. Patient agreeable  Scheduled PUS.  Advised patient of 72 hour cancellation policy and $100 cancellation fee. Patient agreeable.

## 2014-02-20 ENCOUNTER — Ambulatory Visit (INDEPENDENT_AMBULATORY_CARE_PROVIDER_SITE_OTHER): Payer: Federal, State, Local not specified - PPO

## 2014-02-20 ENCOUNTER — Ambulatory Visit (INDEPENDENT_AMBULATORY_CARE_PROVIDER_SITE_OTHER): Payer: Federal, State, Local not specified - PPO | Admitting: Gynecology

## 2014-02-20 VITALS — BP 120/90 | Resp 18 | Ht 60.75 in | Wt 181.0 lb

## 2014-02-20 DIAGNOSIS — R6889 Other general symptoms and signs: Secondary | ICD-10-CM

## 2014-02-20 DIAGNOSIS — N949 Unspecified condition associated with female genital organs and menstrual cycle: Secondary | ICD-10-CM

## 2014-02-20 DIAGNOSIS — Z8619 Personal history of other infectious and parasitic diseases: Secondary | ICD-10-CM

## 2014-02-20 DIAGNOSIS — N839 Noninflammatory disorder of ovary, fallopian tube and broad ligament, unspecified: Secondary | ICD-10-CM

## 2014-02-20 DIAGNOSIS — IMO0002 Reserved for concepts with insufficient information to code with codable children: Secondary | ICD-10-CM

## 2014-02-20 DIAGNOSIS — R102 Pelvic and perineal pain: Secondary | ICD-10-CM

## 2014-02-20 DIAGNOSIS — N838 Other noninflammatory disorders of ovary, fallopian tube and broad ligament: Secondary | ICD-10-CM

## 2014-02-20 NOTE — Progress Notes (Signed)
       Pt here for evaluation of right sided fullness on PE with pelvic pain and family history of endometriosis/-omas. Pt has had CTM twice. Images reviewed with pt.  Uterus is anteverted, normal appearance, slight rotation to right on transverse image.  Ovaries normal. Discussed that she may have an element of scar tissue from her 2 infections and can not rule out endometriosis but there is no evidence of endometrioma.  Recommend deferring further evaluation, such as surgery unless pain not treated by current management. Pt agreeable Reminded re LGSIL with CIN I on multiple biopsies, repeat pap next year stressed importance of follow up. 3845m spent counseling, >50% face to face

## 2014-03-05 ENCOUNTER — Ambulatory Visit (HOSPITAL_COMMUNITY): Payer: Self-pay | Admitting: Psychiatry

## 2014-03-07 ENCOUNTER — Other Ambulatory Visit (HOSPITAL_COMMUNITY): Payer: Self-pay | Admitting: Psychiatry

## 2014-04-02 ENCOUNTER — Ambulatory Visit (HOSPITAL_COMMUNITY): Payer: Self-pay | Admitting: Psychiatry

## 2014-04-23 ENCOUNTER — Encounter (HOSPITAL_COMMUNITY): Payer: Self-pay | Admitting: Psychiatry

## 2014-04-23 ENCOUNTER — Ambulatory Visit (INDEPENDENT_AMBULATORY_CARE_PROVIDER_SITE_OTHER): Payer: Federal, State, Local not specified - PPO | Admitting: Psychiatry

## 2014-04-23 VITALS — BP 122/82 | Ht 60.75 in | Wt 187.0 lb

## 2014-04-23 DIAGNOSIS — F411 Generalized anxiety disorder: Secondary | ICD-10-CM

## 2014-04-23 DIAGNOSIS — F325 Major depressive disorder, single episode, in full remission: Secondary | ICD-10-CM

## 2014-04-23 DIAGNOSIS — F329 Major depressive disorder, single episode, unspecified: Secondary | ICD-10-CM

## 2014-04-23 MED ORDER — BUSPIRONE HCL 10 MG PO TABS: 10.0000 mg | ORAL_TABLET | Freq: Two times a day (BID) | ORAL | Status: DC

## 2014-04-23 MED ORDER — BUPROPION HCL ER (XL) 300 MG PO TB24: 300.0000 mg | ORAL_TABLET | ORAL | Status: DC

## 2014-04-23 NOTE — Progress Notes (Signed)
Patient ID: Yolanda MayersBria L Callahan, female   DOB: Mar 09, 1991, 23 y.o.   MRN: [id number]   [facility]  Yolanda MayersBria L Callahan Mar 09, 1991   Date of visit 04/23/2014  Subjective: Patient is a 23 year-old female diagnosed with major depressive disorder, generalized anxiety disorder who presents today for a followup visit.  Patient states that  she is doing better with her depression. She has that she's also not having any problems with anxiety and on  a scale of 0-10, with 0 being no symptoms and 10 being the worst the patient rates her anxiety as a 1/10 and her depression also a 1/10. Patient however reports that she's been struggling in her relationship with mom. On being asked to elaborate, patient states that her mom wants her to follow the rules at home, let her know when she's going to be back, wants her to help her out of the house and she gets frustrated because of this. Dad agrees with this and states that patient needs to respect the house rules as she decides fair. Patient states that she plans to work on following the rules and also working on her relationship with mom. Patient denies any other complaints, any side effects of the medication, any safety issues at this visit   Patient also denies any decreased need for sleep, any racing thoughts, any increased mood irritability, any grandiosity. She also denies any psychotic symptoms, substance abuse issues at this visit  Active Ambulatory Problems    Diagnosis Date Noted  . GAD (generalized anxiety disorder) 07/23/2011  . CIN I (cervical intraepithelial neoplasia I)   . MDD (major depressive disorder) 10/27/2012  . H/O chlamydia infection 01/17/2014   Resolved Ambulatory Problems    Diagnosis Date Noted  . No Resolved Ambulatory Problems   Past Medical History  Diagnosis Date  . Asthma   . Seasonal allergies   . Polycystic ovarian disease   . Anxiety   . Depression   . Chlamydia contact, treated  07/2013  Family History: Father has substance abuse and has recently been diagnosed with Bipolar Disorder  Social history: Patient is in college and is [place]  Current outpatient prescriptions:albuterol (PROVENTIL HFA;VENTOLIN HFA) 108 (90 BASE) MCG/ACT inhaler, Inhale 2 puffs into the lungs every 4 (four) hours as needed for wheezing., Disp: 1 Inhaler, Rfl: 2;  buPROPion (WELLBUTRIN XL) 300 MG 24 hr tablet, Take 1 tablet (300 mg total) by mouth every morning., Disp: 30 tablet, Rfl: 3;  busPIRone (BUSPAR) 10 MG tablet, Take 1 tablet (10 mg total) by mouth 2 (two) times daily., Disp: 60 tablet, Rfl: 3 desogestrel-ethinyl estradiol (VIORELE) 0.15-0.02/0.01 MG (21/5) tablet, Take 1 tablet by mouth daily., Disp: 3 Package, Rfl: 3;  fluconazole (DIFLUCAN) 150 MG tablet, one tablet by mouth, repeat in 3d, Disp: 2 tablet, Rfl: 0;  montelukast (SINGULAIR) 10 MG tablet, Take 10 mg by mouth as needed., Disp: , Rfl: ;  Multiple Vitamin (MULTIVITAMIN) tablet, Take 1 tablet by mouth daily., Disp: , Rfl:  Spacer/Aero-Holding Chambers (E-Z SPACER) inhaler, as needed. Use as instructed, Disp: , Rfl:     Review of Systems  Constitutional: Negative.  Negative for fever and weight loss.  HENT: Negative.  Negative for congestion and sore throat.   Cardiovascular: Negative.  Negative for chest pain and palpitations.  Neurological: Negative.  Negative for dizziness, focal weakness, seizures, loss of consciousness, weakness and headaches.  Psychiatric/Behavioral: Negative for depression, suicidal ideas, hallucinations, memory  loss and substance abuse. The patient is not nervous/anxious and does not have insomnia.    General Appearance: alert, oriented, no acute distress and well nourished  Musculoskeletal: Strength & Muscle Tone: within normal limits Gait & Station: normal Patient leans: N/A  Blood pressure 122/82, height 5' 0.75" (1.543 m), weight 187 lb (84.823  kg).  Mental Status Examination  Appearance: Casually dressed Alert: Yes Attention: fair  Cooperative: Yes Eye Contact: Fair Speech: Normal in volume, rate, tone, spontaneous  Psychomotor Activity: Normal Memory/Concentration: OK Oriented: person, place and situation Mood: Euthymic Affect: Full Range Thought Processes and Associations: Goal Directed Fund of Knowledge: Good Thought Content: Suicidal ideation, Homicidal ideation, Auditory hallucinations, Visual hallucinations, Delusions and Paranoia, none reported Insight: Fair to poor Judgement: Fair Language: Fair  Diagnosis: Generalized anxiety disorder, major depressive disorder  Treatment Plan:  Major depressive disorder: Continue Wellbutrin XL 300 MG one daliy for depression and to help with focus. Generalized anxiety disorder:  Continue BuSpar to 10 mg one pill twice daily for anxiety Insomnia: Continue melatonin 3 mg one or 2 pills at bedtime when necessary for sleep. Rule out mood disorder NOS: Discussed again symptoms of bipolar disorder in length at this visit with patient and dad. Patient denies these  Discussed diet and exercise again in length with the patient at this visit. Call when necessary Followup in 3 to 4 months 50% of this visit was spent in discussing the need for patient to follow rules at home, be respectful towards mom. Also discussed symptomatology of bipolar disorder in length with patient and dad at this visit. Dad was recently diagnosed with bipolar disorder and substance abuse  Nelly Rout, MD

## 2014-05-14 ENCOUNTER — Ambulatory Visit (INDEPENDENT_AMBULATORY_CARE_PROVIDER_SITE_OTHER): Payer: Federal, State, Local not specified - PPO | Admitting: Internal Medicine

## 2014-05-14 VITALS — BP 110/76 | HR 95 | Temp 100.1°F | Resp 24 | Ht 59.75 in | Wt 189.0 lb

## 2014-05-14 DIAGNOSIS — J22 Unspecified acute lower respiratory infection: Secondary | ICD-10-CM

## 2014-05-14 DIAGNOSIS — J988 Other specified respiratory disorders: Secondary | ICD-10-CM

## 2014-05-14 DIAGNOSIS — J4521 Mild intermittent asthma with (acute) exacerbation: Secondary | ICD-10-CM

## 2014-05-14 MED ORDER — PREDNISONE 20 MG PO TABS: ORAL_TABLET | ORAL | Status: DC

## 2014-05-14 MED ORDER — AZITHROMYCIN 250 MG PO TABS: ORAL_TABLET | ORAL | Status: DC

## 2014-05-14 MED ORDER — HYDROCODONE-HOMATROPINE 5-1.5 MG/5ML PO SYRP: 5.0000 mL | ORAL_SOLUTION | Freq: Four times a day (QID) | ORAL | Status: DC | PRN

## 2014-05-14 MED ORDER — BENZONATATE 100 MG PO CAPS: 100.0000 mg | ORAL_CAPSULE | Freq: Three times a day (TID) | ORAL | Status: DC

## 2014-05-14 NOTE — Progress Notes (Signed)
Subjective:  This chart was scribed for Sanmina-SCIobert P. Merla Richesoolittle, MD by Marica OtterNusrat Rahman, ED Scribe. This patient was seen in room 10 and the patient's care was started at 8:36 PM.     Patient ID: Yolanda Callahan, female    DOB: 02/17/1991, 23 y.o.   MRN: [id number] Chief Complaint  Patient presents with  . Cough    Started Thursday AM    HPI PCP: No PCP Per Patient HPI Comments: Yolanda Callahan is a [age] y.o. female, with medical Hx noted below significant for asthma, who presents to the [facility] complaining of intermittent, dry, cough onset 4 days ago. Pt reports using an inhaler (Albuterol) today with little relief. Prior to using her inhaler today, the last time she used an inhaler was during allergy season. Pt denies body aches, ear pain, nasal congestion. Pt denies having the flu shot this year. 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   Past Medical History  Diagnosis Date  . Asthma   . Seasonal allergies   . Polycystic ovarian disease   . CIN I (cervical intraepithelial neoplasia I) 2012  . Anxiety   . Depression   . Chlamydia contact, treated 07/2013    treated 2014, 2015   Prior to Admission medications   Medication Sig Start Date End Date Taking? Authorizing Provider  albuterol (PROVENTIL HFA;VENTOLIN HFA) 108 (90 BASE) MCG/ACT inhaler Inhale 2 puffs into the lungs every 4 (four) hours as needed for wheezing. 11/15/12  Yes Ryan M Dunn, PA-C  buPROPion (WELLBUTRIN XL) 300 MG 24 hr tablet Take 1 tablet (300 mg total) by mouth every morning. 04/23/14 04/23/15 Yes Nelly RoutArchana Kumar, MD  busPIRone (BUSPAR) 10 MG tablet Take 1 tablet (10 mg total) by mouth 2 (two) times daily. 04/23/14  Yes Nelly RoutArchana Kumar, MD  desogestrel-ethinyl estradiol (VIORELE) 0.15-0.02/0.01 MG (21/5) tablet Take 1 tablet by mouth daily. 01/17/14  Yes Bennye Almracy H Lathrop, MD  Multiple Vitamin (MULTIVITAMIN) tablet Take 1 tablet by mouth daily.   Yes  Historical Provider, MD  Spacer/Aero-Holding Chambers (E-Z SPACER) inhaler as needed. Use as instructed 11/15/12  Yes Ryan M Dunn, PA-C  montelukast (SINGULAIR) 10 MG tablet Take 10 mg by mouth as needed. 11/15/12   Sondra Bargesyan M Dunn, PA-C   Allergies  Allergen Reactions  . Penicillins Hives   Review of Systems  Constitutional: Negative for fever and chills.  HENT: Negative for congestion.   Respiratory: Positive for cough.        Objective:   Physical Exam  Nursing note and vitals reviewed. Constitutional: She is oriented to person, place, and time. She appears well-developed and well-nourished. No distress.  HENT:  Head: Normocephalic and atraumatic.  Right Ear: External ear normal.  Left Ear: External ear normal.  Nose: Nose normal.  Mouth/Throat: Oropharynx is clear and moist. No oropharyngeal exudate.  No lymph nodes  Eyes: Conjunctivae and EOM are normal.  Neck: Neck supple. No tracheal deviation present.  Cardiovascular: Normal rate.   Pulmonary/Chest: Effort normal. No respiratory distress. She has wheezes (delayed expiration phase).  Musculoskeletal: Normal range of motion.  Neurological: She is alert and oriented to person, place, and time.  Skin: Skin is warm and dry.  Psychiatric: She has a normal mood and affect. Her behavior is normal.      Assessment & Plan:  DIAGNOSTIC STUDIES: Oxygen Saturation is 99% on RA, nl by my interpretation.  COORDINATION OF CARE: 8:39 PM-Discussed treatment plan which includes meds with pt at bedside and pt agreed to plan.   Asthma with acute exacerbation, mild intermittent  Lower resp. tract infection   Meds ordered this encounter  Medications  . azithromycin (ZITHROMAX) 250 MG tablet    Sig: As packaged    Dispense:  6 tablet    Refill:  0  . predniSONE (DELTASONE) 20 MG tablet    Sig: 3/3/2/2/1/1 single daily dose for 6 days    Dispense:  12 tablet    Refill:  0  . benzonatate (TESSALON) 100 MG capsule    Sig: Take 1  capsule (100 mg total) by mouth 3 (three) times daily.    Dispense:  21 capsule    Refill:  0  . HYDROcodone-homatropine (HYCODAN) 5-1.5 MG/5ML syrup    Sig: Take 5 mLs by mouth every 6 (six) hours as needed.    Dispense:  120 mL    Refill:  0    I have completed the patient encounter in its entirety as documented by the scribe, with editing by me where necessary. Kaydan Wilhoite P. Merla Richesoolittle, M.D.

## 2014-05-16 ENCOUNTER — Telehealth (HOSPITAL_COMMUNITY): Payer: Self-pay

## 2014-05-21 ENCOUNTER — Telehealth (HOSPITAL_COMMUNITY): Payer: Self-pay

## 2014-05-21 ENCOUNTER — Ambulatory Visit: Payer: Federal, State, Local not specified - PPO

## 2014-05-21 ENCOUNTER — Telehealth: Payer: Self-pay | Admitting: Gynecology

## 2014-05-21 NOTE — Telephone Encounter (Signed)
Call to [facility] and [facility] about a missed appointment for 3rd gardasil/RD

## 2014-05-21 NOTE — Telephone Encounter (Signed)
I will send this message along to Triage so they can also try to reach the patient this week.

## 2014-05-23 ENCOUNTER — Ambulatory Visit (HOSPITAL_COMMUNITY)
Admission: RE | Admit: 2014-05-23 | Discharge: 2014-05-23 | Disposition: A | Discharge status: Home / Self Care | Payer: Federal, State, Local not specified - PPO | Attending: Psychiatry | Admitting: Psychiatry

## 2014-05-23 NOTE — Telephone Encounter (Signed)
Spoke with patient. Patient requests Tuesday or Thursday morning appointment. Appointment scheduled for 11/10 at 8:30am for 3rd Gardasil. Patient is agreeable to date and time.  Routing to provider for final review. Patient agreeable to disposition. Will close encounter

## 2014-05-23 NOTE — BH Assessment (Signed)
Assessment Note  Yolanda Callahan is an 23 y.o. female. Pt presents with c/o increased depression and anxiety. Pt reports that she contemplated suicide last Tuesday after an altercation with her mother. Pt reports that her mother removed any alcohol in the home and patient confronted her mother at working demanding that her mother give her alcohol last Tuesday. Pt became angry with mother because of this and threatened suicide by sitting in her car hoping that the carbon monoxide fumes would kill her patient was in a parking lot open outdoor space sitting in her car. Pt's mother reports that she addressed the issues and talked to her daughter about her wreckless behavior and her daughter agreed not to do that again.   Pt's mother reports prior episode the week before [employer] homecoming where patient reportedly told her that she ingested her anxiety medications as an attempt to overdose on her medication. Pt concurs this story. Patient reports that she has been feeling depressed and anxious over the past month.  Pt reports increased partying and drinking etoh only on Friday's after work. 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 Pt reports that she sleeps all the time and has not been attending classes regularly since the beginning of September. Pt reports that she has not been compliant with her psych meds because she is not suppose take her meds while she has consumed etoh. Pt reports a recent break-up with a guy she was dating. Pt denies current SI,HI, and no AVH. Pt is able to contract for safety and is agreeable to participate in Psych IOP. Pt requested to be referred to a new psychiatrist and a therapist. Writer attempted to schedule appt. at [facility] who will follow-up with patient for scheduling and will  determine if patient can be seen as a new patient as patient's father is also scheduled at that provider which may present as a conflict of  interest for provider/  Consulted with Tanna Savoy and Dr.Irving [place] whom is requesting that patient be referred to Psych IOP and OPT as patient does not meet inpatient treatment criteria. Pt is able to contract and terms of contract reviewed with patient and mother whom were in agreement with terms and recommended follow-up care. Spoke with Jeri Modena who scheduled patient to start IOP on 05/31/14 at 8:45. Pt agreeable to do so and if not she will reschedule start date with Miltona Sink. IOP and crisis resources provided.  Axis I: Major Depression, Recurrent severe Axis II: Deferred Axis III:  Past Medical History  Diagnosis Date  . Asthma   . Seasonal allergies   . Polycystic ovarian disease   . CIN I (cervical intraepithelial neoplasia I) 2012  . Anxiety   . Depression   . Chlamydia contact, treated 07/2013    treated 2014, 2015   Axis IV: other psychosocial or environmental problems, problems related to social environment and problems with primary support group Axis V: 41-50 serious symptoms  Past Medical History:  Past Medical History  Diagnosis Date  . Asthma   . Seasonal allergies   . Polycystic ovarian disease   . CIN I (cervical intraepithelial neoplasia I) 2012  . Anxiety   . Depression   . Chlamydia contact, treated 07/2013    treated 2014, 2015    Past Surgical History  Procedure Laterality Date  . Wisdom tooth extraction  06/2013  . Colposcopy  2012  . Mouth surgery  2008  Family History:  Family History  Problem Relation Age of Onset  . Anxiety disorder Father   . Hypertension Father   . Anxiety disorder Paternal Grandmother   . Depression Paternal Grandmother   . Diabetes Paternal Grandmother   . Hypertension Mother   . Heart disease Maternal Grandmother   . Diabetes Paternal Grandfather   . Hypertension Maternal Grandfather   . Endometriosis Mother   . Fibroids Maternal Grandmother   . Osteoporosis Maternal Grandmother     Social History:  reports that  she has quit smoking. She has never used smokeless tobacco. She reports that she drinks about 1.5 oz of alcohol per week. She reports that she does not use illicit drugs.  Additional Social History:  Alcohol / Drug Use History of alcohol / drug use?: Yes Negative Consequences of Use: Personal relationships Substance #1 Name of Substance 1:  (Etoh-Liquor) 1 - Age of First Use:  (20) 1 - Amount (size/oz):  (denies daily use-5-6 shots ) 1 - Frequency:  (pt reports that she drinks mostly on Friday's after work) 1 - Duration:  (on-going use since age 23) 1 - Last Use / Amount:  (05/18/14-6 shots)  CIWA:   COWS:    Allergies:  Allergies  Allergen Reactions  . Penicillins Hives    Home Medications:  (Not in a hospital admission)  OB/GYN Status:  Patient's last menstrual period was 04/16/2014.  General Assessment Data Location of Assessment: [facility] Is this a Tele or Face-to-Face Assessment?: Face-to-Face Is this an Initial Assessment or a Re-assessment for this encounter?: Initial Assessment Living Arrangements: Parent Can pt return to current living arrangement?: Yes Admission Status: Voluntary Is patient capable of signing voluntary admission?: Yes Transfer from: Home Referral Source: Self/Family/Friend     [facility] Living Arrangements: Parent Name of Psychiatrist: Dr. Nelly RoutArchana Kumar Name of Therapist: No Current Provider  Education Status Is patient currently in school?: Yes Current Grade: Senior in College Highest grade of school patient has completed: 12th grade and some college Name of school: [employer] Contact person: NA  Risk to self with the past 6 months Suicidal Ideation: No Suicidal Intent: No Is patient at risk for suicide?: Yes Suicidal Plan?: No Access to Means: No What has been your use of drugs/alcohol within the last 12 months?: Etoh Previous Attempts/Gestures: Yes How many times?: 3 Other Self Harm  Risks: hx of cutting in high school Triggers for Past Attempts: Family contact Intentional Self Injurious Behavior: Cutting Comment - Self Injurious Behavior: hx of cutting in high school Family Suicide History: No (Pt's father is diagnosed with Bipolar and has been hospitali) Recent stressful life event(s): Conflict (Comment), Other (Comment) Persecutory voices/beliefs?: No Depression: Yes Depression Symptoms: Insomnia, Loss of interest in usual pleasures, Feeling worthless/self pity, Feeling angry/irritable Substance abuse history and/or treatment for substance abuse?: Yes Suicide prevention information given to non-admitted patients: Yes  Risk to Others within the past 6 months Homicidal Ideation: No Thoughts of Harm to Others: No Current Homicidal Intent: No Current Homicidal Plan: No Access to Homicidal Means: No Identified Victim: No History of harm to others?: No Assessment of Violence: None Noted Violent Behavior Description: None Noted Does patient have access to weapons?: No Criminal Charges Pending?: No Does patient have a court date: No  Psychosis Hallucinations: None noted Delusions: None noted  Mental Status Report Appear/Hygiene: Other (Comment) (Neat appearance) Eye Contact: Good Motor Activity: Freedom of movement Speech: Logical/coherent Level of Consciousness: Alert Mood: Depressed,  Anxious Affect: Anxious, Depressed Anxiety Level: Minimal Thought Processes: Coherent, Relevant Judgement: Unimpaired Orientation: Person, Place, Time, Situation Obsessive Compulsive Thoughts/Behaviors: None  Cognitive Functioning Concentration: Normal Memory: Recent Intact, Remote Intact IQ: Average Insight: Fair Impulse Control: Poor Appetite: Fair Weight Loss: 0 Weight Gain: 0 Sleep: Increased Total Hours of Sleep:  (7-8 hours (states she normally sleeps 5-6hrs)) Vegetative Symptoms: None  ADLScreening [facility] Assessment Services) Patient's cognitive ability  adequate to safely complete daily activities?: Yes Patient able to express need for assistance with ADLs?: Yes Independently performs ADLs?: Yes (appropriate for developmental age)  Prior Inpatient Therapy Prior Inpatient Therapy: Yes Prior Therapy Dates: age 23 [facility] Reason for Treatment: overdose on pills  Prior Outpatient Therapy Prior Outpatient Therapy: Yes Prior Therapy Dates: Current Provider Prior Therapy Facilty/Provider(s[facility]/Dr. Lucianne MussKumar Reason for Treatment: Psychiatry  ADL Screening (condition at time of admission) Patient's cognitive ability adequate to safely complete daily activities?: Yes Is the patient deaf or have difficulty hearing?: No Does the patient have difficulty seeing, even when wearing glasses/contacts?: No Does the patient have difficulty concentrating, remembering, or making decisions?: No Patient able to express need for assistance with ADLs?: Yes Does the patient have difficulty dressing or bathing?: No Independently performs ADLs?: Yes (appropriate for developmental age) Does the patient have difficulty walking or climbing stairs?: No Weakness of Legs: None Weakness of Arms/Hands: None  Home Assistive Devices/Equipment Home Assistive Devices/Equipment: None    Abuse/Neglect Assessment (Assessment to be complete while patient is alone) Physical Abuse: Denies Verbal Abuse: Denies Sexual Abuse: Denies Exploitation of patient/patient's resources: Denies Self-Neglect: Denies     Merchant navy officerAdvance Directives (For Healthcare) Does patient have an advance directive?: No Would patient like information on creating an advanced directive?: No - patient declined information    Additional Information 1:1 In Past 12 Months?: No CIRT Risk: No Elopement Risk: No Does patient have medical clearance?: No     Disposition:  Disposition Initial Assessment Completed for this Encounter: Yes Disposition of Patient:  Outpatient treatment, Other dispositions (Psych IOP-start date 05/31/14) Type of outpatient treatment: Psych Intensive Outpatient Other disposition(s): Other (Comment) (Per Dr. Dub MikesLugo pt does not meet inpatient tx criteria)  On Site Evaluation by:   Reviewed with Physician:    Gerline LegacyPresley, Gennie Eisinger Sabreen Amirrah Quigley, MS, LCASA Assessment Counselor  05/23/2014 6:04 PM

## 2014-05-29 ENCOUNTER — Ambulatory Visit: Payer: Self-pay

## 2014-06-07 ENCOUNTER — Ambulatory Visit: Payer: Self-pay

## 2014-06-11 ENCOUNTER — Other Ambulatory Visit (HOSPITAL_COMMUNITY): Payer: Federal, State, Local not specified - PPO | Attending: Psychiatry | Admitting: Licensed Clinical Social Worker

## 2014-06-11 ENCOUNTER — Encounter (HOSPITAL_COMMUNITY): Payer: Self-pay | Admitting: Licensed Clinical Social Worker

## 2014-06-11 DIAGNOSIS — G4700 Insomnia, unspecified: Secondary | ICD-10-CM | POA: Insufficient documentation

## 2014-06-11 DIAGNOSIS — F331 Major depressive disorder, recurrent, moderate: Secondary | ICD-10-CM

## 2014-06-11 DIAGNOSIS — F411 Generalized anxiety disorder: Secondary | ICD-10-CM | POA: Diagnosis not present

## 2014-06-11 DIAGNOSIS — F329 Major depressive disorder, single episode, unspecified: Secondary | ICD-10-CM | POA: Insufficient documentation

## 2014-06-11 NOTE — Psych (Signed)
[facility] Assessment Note  Date: 06/11/2014 Name: Yolanda MayersBria L Espinal MRN: [id number]  Chief Complaint: Depression, anxiety, coping skills. School is a main stressor and balancing time between school, work and major is a main stressor.   Subjective:   HPI: Patient is a 23 y.o. African American female presents with depression and anxiety.  Patient was enrolled in partial psychiatric program on 06/11/2014.  Primary complaints include: anxiety, difficulty with school, feeling depressed, financial problems, relationship difficulties, stressed at work, impulsivity and learning to forgive herself for past.  Onset of symptoms was gradual due to stressors  with rapidly worsening course when she reported suicide ideation with plan last week of October and was taken to ER. She reports not taking her medications was a factor in worsening of symptoms. Psychosocial Stressors include the following: school, relationships, work, and financial.    I have reviewed the following documentation: past psychiatric history, referral for Partial Hospitalization, most recent progress note from ER, past medical history, past social and family history. Client reports a post suicide attempt in 2010 when she took bunch of pills and cough syrup and was admitted inpatient. She reports it was related to the gossip and drama in school and she was a sophomore in high school. She has seen Dr. Lucianne MussKumar since that time. She has [facility], but has not seen her for over a year, and has been to a school counselor recently twice.   Complaints of Pain: none Past Psychiatric History:  Past psychiatric hospitalizations 1, Previous suicide attempts 1 and Therapy, [facility] and at [employer], her school.  Currently in treatment with Dr. Lucianne MussKumar and her counselor at [employer]. .  Substance Abuse History: none Use of Alcohol: minimizes use and relates drinking 23 oz 1x per week Use of  Caffeine: denies use Use of over the counter: None  Past Surgical History  Procedure Laterality Date  . Wisdom tooth extraction  06/2013  . Colposcopy  2012  . Mouth surgery  2008    Past Medical History  Diagnosis Date  . Asthma   . Seasonal allergies   . Polycystic ovarian disease   . CIN I (cervical intraepithelial neoplasia I) 2012  . Anxiety   . Depression   . Chlamydia contact, treated 07/2013    treated 2014, 2015   Outpatient Encounter Prescriptions as of 06/11/2014  Medication Sig  . buPROPion (WELLBUTRIN XL) 300 MG 24 hr tablet Take 1 tablet (300 mg total) by mouth every morning.  . busPIRone (BUSPAR) 10 MG tablet Take 1 tablet (10 mg total) by mouth 2 (two) times daily.  Marland Kitchen. albuterol (PROVENTIL HFA;VENTOLIN HFA) 108 (90 BASE) MCG/ACT inhaler Inhale 2 puffs into the lungs every 4 (four) hours as needed for wheezing.  Marland Kitchen. azithromycin (ZITHROMAX) 250 MG tablet As packaged  . benzonatate (TESSALON) 100 MG capsule Take 1 capsule (100 mg total) by mouth 3 (three) times daily.  Marland Kitchen. desogestrel-ethinyl estradiol (VIORELE) 0.15-0.02/0.01 MG (21/5) tablet Take 1 tablet by mouth daily.  Marland Kitchen. HYDROcodone-homatropine (HYCODAN) 5-1.5 MG/5ML syrup Take 5 mLs by mouth every 6 (six) hours as needed.  . montelukast (SINGULAIR) 10 MG tablet Take 10 mg by mouth as needed.  . Multiple Vitamin (MULTIVITAMIN) tablet Take 1 tablet by mouth daily.  . predniSONE (DELTASONE) 20 MG tablet 3/3/2/2/1/1 single daily dose for 6 days  . Spacer/Aero-Holding Chambers (E-Z SPACER) inhaler as needed. Use as instructed   Allergies  Allergen Reactions  .  Penicillins Hives    History  Substance Use Topics  . Smoking status: Former Games developermoker  . Smokeless tobacco: Never Used  . Alcohol Use: 3.0 oz/week    3 Not specified, 2 Cans of beer per week     Comment: Patient states she drinks 23 oz beer per week   Functioning Relationships: she reports a good support system but [facility]  relationship is strained with her Mom. She has anxiety about her Dad being in the hospital and his mental health progression. She has friends but can be a stressor for her.  Education: College       Please specify degree: She is in her senior year of college and she is an Catering managerducation major.  Other Pertinent History: None Family History  Problem Relation Age of Onset  . Anxiety disorder Father   . Hypertension Father   . Bipolar disorder Father   . Anxiety disorder Paternal Grandmother   . Depression Paternal Grandmother   . Diabetes Paternal Grandmother   . Hypertension Mother   . Endometriosis Mother   . Heart disease Maternal Grandmother   . Fibroids Maternal Grandmother   . Osteoporosis Maternal Grandmother   . Diabetes Paternal Grandfather   . Hypertension Maternal Grandfather      Review of Systems Behavioral/Psych: positive for anxiety, behavior problems, depression and school difficulties  Objective:  There were no vitals filed for this visit.  Physical Exam: No exam performed today, no exam necessary.  Mental Status Exam: Appearance:  Well groomed Psychomotor::  Within Normal Limits Attention span and concentration: Normal Behavior: cooperative Speech:  normal volume Mood:  anxious Affect:  normal and mood-congruent Thought Process:  within normal limits Thought Content:  not suicidal Orientation:  person, place, time/date, situation, day of week, month of year and year Cognition:  intact Insight:  fair Judgment:  Poor Estimate of Intelligence: Average Fund of knowledge: Intact Memory: Recent and remote intact Abnormal movements: None Gait and station: Normal  Assessment:  Diagnosis: No Primary Diagnosis found 1. Major depressive disorder, recurrent episode, moderate   2. Generalized anxiety disorder   Rule Out Bipolar spectrum  Indications for admission: Client had reported recent suicide attempt where she was taken to the ER and severe stressors  including school, work, relationships with impulsive behaviors and poor coping skills. Partial Hospitalization is recommended to decrease impulsivity, help improve depression and anxiety and help with medication compliance. Client willing to start PHP but chooses to start after she finishes exam on 06/27/14. We will reevaluate for admission at that time.   Plan: patient enrolled in Partial Hospitalization Program, patient's current medications are to be continued, a comprehensive treatment plan will be developed and reevaluate client's symptoms on start date of 06/27/14.  Treatment options and alternatives reviewed with patient and patient understands the above plan.   Comments: Client collaborated and agreed to treatment plan.     Trinity Haun A

## 2014-07-03 ENCOUNTER — Telehealth: Payer: Self-pay | Admitting: Gynecology

## 2014-07-03 ENCOUNTER — Telehealth (HOSPITAL_COMMUNITY): Payer: Self-pay | Admitting: Licensed Clinical Social Worker

## 2014-07-03 NOTE — Telephone Encounter (Signed)
Pt called asking to reschedule her nurse visit for tomorrow 07/04/14. It is for her 3rd gardasil which has been cancelled multiple times. Due to timing of shots, please call pt to discuss and reschedule.  bf

## 2014-07-03 NOTE — Telephone Encounter (Signed)
Yolanda Callahan, please contact patient to reschedule Gardasil. Advised patient needs to schedule injection soon in order to maximize immune response.

## 2014-07-03 NOTE — Telephone Encounter (Signed)
Left Message To Call Back  

## 2014-07-04 ENCOUNTER — Ambulatory Visit: Payer: Self-pay

## 2014-07-06 NOTE — Telephone Encounter (Signed)
Left Message To Call Back  

## 2014-07-18 NOTE — Telephone Encounter (Signed)
Left Message To Call Back  

## 2014-07-24 ENCOUNTER — Ambulatory Visit (HOSPITAL_COMMUNITY): Payer: Self-pay | Admitting: Psychiatry

## 2014-07-24 NOTE — Telephone Encounter (Signed)
No further follow up is required.  I have closed the encounter.

## 2014-07-24 NOTE — Telephone Encounter (Signed)
No response from patient regarding 3rd Gardasil injection. Any further follow-up needed? Last appointment 02-20-2014 for PUS with Dr Farrel GobbleLathrop. AEX 01-17-14 with Dr Farrel GobbleLathrop. She does not have any future appointments scheduled.

## 2014-09-04 ENCOUNTER — Ambulatory Visit (HOSPITAL_COMMUNITY): Payer: Self-pay | Admitting: Psychiatry

## 2014-10-05 ENCOUNTER — Encounter: Payer: Self-pay | Admitting: Obstetrics & Gynecology

## 2015-01-28 ENCOUNTER — Ambulatory Visit: Payer: Federal, State, Local not specified - PPO | Admitting: Gynecology

## 2015-02-26 ENCOUNTER — Other Ambulatory Visit: Payer: Self-pay | Admitting: *Deleted

## 2015-02-26 DIAGNOSIS — Z3041 Encounter for surveillance of contraceptive pills: Secondary | ICD-10-CM

## 2015-02-26 MED ORDER — DESOGESTREL-ETHINYL ESTRADIOL 0.15-0.02/0.01 MG (21/5) PO TABS: 1.0000 | ORAL_TABLET | Freq: Every day | ORAL | Status: DC

## 2015-02-26 NOTE — Telephone Encounter (Signed)
Faxed Medication refill request from [facility]: Viorele  Last AEX:  01/17/2014 with TL Next AEX: 04/10/15 with JJ Last MMG (if hormonal medication request): n/a Refill authorized: #[id number] only

## 2015-04-10 ENCOUNTER — Ambulatory Visit: Payer: Federal, State, Local not specified - PPO | Admitting: Obstetrics and Gynecology

## 2015-04-10 ENCOUNTER — Encounter: Payer: Self-pay | Admitting: Obstetrics and Gynecology

## 2015-05-13 ENCOUNTER — Ambulatory Visit (INDEPENDENT_AMBULATORY_CARE_PROVIDER_SITE_OTHER): Payer: Federal, State, Local not specified - PPO | Admitting: Family Medicine

## 2015-05-13 VITALS — BP 116/66 | HR 88 | Temp 97.7°F | Resp 20 | Ht 61.0 in | Wt 205.2 lb

## 2015-05-13 DIAGNOSIS — R05 Cough: Secondary | ICD-10-CM

## 2015-05-13 DIAGNOSIS — R0602 Shortness of breath: Secondary | ICD-10-CM

## 2015-05-13 DIAGNOSIS — R062 Wheezing: Secondary | ICD-10-CM

## 2015-05-13 DIAGNOSIS — R059 Cough, unspecified: Secondary | ICD-10-CM

## 2015-05-13 DIAGNOSIS — J4521 Mild intermittent asthma with (acute) exacerbation: Secondary | ICD-10-CM

## 2015-05-13 MED ORDER — ALBUTEROL SULFATE HFA 108 (90 BASE) MCG/ACT IN AERS: 2.0000 | INHALATION_SPRAY | RESPIRATORY_TRACT | Status: DC | PRN

## 2015-05-13 MED ORDER — HYDROCODONE-HOMATROPINE 5-1.5 MG/5ML PO SYRP: 5.0000 mL | ORAL_SOLUTION | Freq: Three times a day (TID) | ORAL | Status: DC | PRN

## 2015-05-13 MED ORDER — PREDNISONE 20 MG PO TABS: ORAL_TABLET | ORAL | Status: DC

## 2015-05-13 NOTE — Patient Instructions (Signed)
Asthma Attack Prevention While you may not be able to control the fact that you have asthma, you can take actions to prevent asthma attacks. The best way to prevent asthma attacks is to maintain good control of your asthma. You can achieve this by:  Taking your medicines as directed.  Avoiding things that can irritate your airways or make your asthma symptoms worse (asthma triggers).  Keeping track of how well your asthma is controlled and of any changes in your symptoms.  Responding quickly to worsening asthma symptoms (asthma attack).  Seeking emergency care when it is needed. WHAT ARE SOME WAYS TO PREVENT AN ASTHMA ATTACK? Have a Plan Work with your health care provider to create a written plan for managing and treating your asthma attacks (asthma action plan). This plan includes:  A list of your asthma triggers and how you can avoid them.  Information on when medicines should be taken and when their dosages should be changed.  The use of a device that measures how well your lungs are working (peak flow meter). Monitor Your Asthma Use your peak flow meter and record your results in a journal every day. A drop in your peak flow numbers on one or more days may indicate the start of an asthma attack. This can happen even before you start to feel symptoms. You can prevent an asthma attack from getting worse by following the steps in your asthma action plan. Avoid Asthma Triggers Work with your asthma health care provider to find out what your asthma triggers are. This can be done by:  Allergy testing.  Keeping a journal that notes when asthma attacks occur and the factors that may have contributed to them.  Determining if there are other medical conditions that are making your asthma worse. Once you have determined your asthma triggers, take steps to avoid them. This may include avoiding excessive or prolonged exposure to:  Dust. Have someone dust and vacuum your home for you once or  twice a week. Using a high-efficiency particulate arrestance (HEPA) vacuum is best.  Smoke. This includes campfire smoke, forest fire smoke, and secondhand smoke from tobacco products.  Pet dander. Avoid contact with animals that you know you are allergic to.  Allergens from trees, grasses or pollens. Avoid spending a lot of time outdoors when pollen counts are high, and on very windy days.  Very cold, dry, or humid air.  Mold.  Foods that contain high amounts of sulfites.  Strong odors.  Outdoor air pollutants, such as engine exhaust.  Indoor air pollutants, such as aerosol sprays and fumes from household cleaners.  Household pests, including dust mites and cockroaches, and pest droppings.  Certain medicines, including NSAIDs. Always talk to your health care provider before stopping or starting any new medicines. Medicines Take over-the-counter and prescription medicines only as told by your health care provider. Many asthma attacks can be prevented by carefully following your medicine schedule. Taking your medicines correctly is especially important when you cannot avoid certain asthma triggers. Act Quickly If an asthma attack does happen, acting quickly can decrease how severe it is and how long it lasts. Take these steps:   Pay attention to your symptoms. If you are coughing, wheezing, or having difficulty breathing, do not wait to see if your symptoms go away on their own. Follow your asthma action plan.  If you have followed your asthma action plan and your symptoms are not improving, call your health care provider or seek immediate medical care   at the nearest hospital. It is important to note how often you need to use your fast-acting rescue inhaler. If you are using your rescue inhaler more often, it may mean that your asthma is not under control. Adjusting your asthma treatment plan may help you to prevent future asthma attacks and help you to gain better control of your  condition. HOW CAN I PREVENT AN ASTHMA ATTACK WHEN I EXERCISE? Follow advice from your health care provider about whether you should use your fast-acting inhaler before exercising. Many people with asthma experience exercise-induced bronchoconstriction (EIB). This condition often worsens during vigorous exercise in cold, humid, or dry environments. Usually, people with EIB can stay very active by pre-treating with a fast-acting inhaler before exercising.   This information is not intended to replace advice given to you by your health care provider. Make sure you discuss any questions you have with your health care provider.   Document Released: 06/24/2009 Document Revised: 03/27/2015 Document Reviewed: 12/06/2014 Elsevier Interactive Patient Education 2016 Elsevier Inc.  

## 2015-05-13 NOTE — Progress Notes (Signed)
° °  Subjective:    Patient ID: Yolanda Callahan, female    DOB: 07-23-1990, 24 y.o.   MRN: [id number] This chart was scribed for Elvina SidleKurt Lauenstein, MD by Littie Deedsichard Sun, Medical Scribe. This patient was seen in Room 11 and the patient's care was started at 8:41 PM.   HPI HPI Comments: Yolanda Callahan is a 24 y.o. female with a history of asthma who presents to the [facility] complaining of gradual onset cough that started about a month ago. She states she has a cough around this time of year every year. The cough does keep her up at night. Patient denies fever. She has had to take prednisone for her asthma before. She does have an inhaler but lost it.  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  Review of Systems  Constitutional: Negative for fever.  Respiratory: Positive for cough.        Objective:   Physical Exam CONSTITUTIONAL: Well developed/well nourished HEAD: Normocephalic/atraumatic EYES: EOM/PERRL ENMT: Mucous membranes moist NECK: supple no meningeal signs SPINE: entire spine nontender CV: S1/S2 noted, no murmurs/rubs/gallops noted LUNGS: Coarse breath sounds ABDOMEN: soft, nontender, no rebound or guarding GU: no cva tenderness NEURO: Pt is awake/alert, moves all extremitiesx4 EXTREMITIES: pulses normal, full ROM SKIN: warm, color normal PSYCH: no abnormalities of mood noted       Assessment & Plan:   By signing my name below, I, Littie Deedsichard Sun, attest that this documentation has been prepared under the direction and in the presence of Elvina SidleKurt Lauenstein, MD.  Electronically Signed: Littie Deedsichard Sun, Medical Scribe. 05/13/2015. 8:41 PM.  This chart was scribed in my presence and reviewed by me personally.    ICD-9-CM ICD-10-CM   1. Asthma with acute exacerbation, mild intermittent 493.92 J45.21 predniSONE (DELTASONE) 20 MG tablet     albuterol (PROVENTIL HFA;VENTOLIN HFA) 108 (90 BASE) MCG/ACT inhaler  2. SOB (shortness of breath) 786.05  R06.02 albuterol (PROVENTIL HFA;VENTOLIN HFA) 108 (90 BASE) MCG/ACT inhaler  3. Wheezing 786.07 R06.2 albuterol (PROVENTIL HFA;VENTOLIN HFA) 108 (90 BASE) MCG/ACT inhaler  4. Cough 786.2 R05 predniSONE (DELTASONE) 20 MG tablet     albuterol (PROVENTIL HFA;VENTOLIN HFA) 108 (90 BASE) MCG/ACT inhaler     HYDROcodone-homatropine (HYCODAN) 5-1.5 MG/5ML syrup     Signed, Elvina SidleKurt Lauenstein, MD

## 2015-08-22 ENCOUNTER — Other Ambulatory Visit: Payer: Self-pay | Admitting: Obstetrics and Gynecology

## 2015-08-22 DIAGNOSIS — Z3041 Encounter for surveillance of contraceptive pills: Secondary | ICD-10-CM

## 2015-08-22 NOTE — Telephone Encounter (Signed)
Patient is asking for a refill of desogestrel-ethinyl estradiol (VIORELE) 0.15-0.02/0.01 MG (21/5) tablet. Confirmed pharmacy.

## 2015-08-23 NOTE — Telephone Encounter (Signed)
Medication refill request: viorele Last AEX:  01-17-14 LGSIL, colpo 02-07-14 CIN1 Next AEX: 08-26-15 Last MMG (if hormonal medication request): n/a Refill authorized: pt called & is asking for 1 pack to get her to her aex. Please advise

## 2015-08-23 NOTE — Telephone Encounter (Signed)
Upon further review pt was last given pack with 0 refills 8/16. Pt should have run out of pills 10/16.  Left message for patient to callback to confirm if she stopped pills at some point.

## 2015-08-23 NOTE — Telephone Encounter (Signed)
Please advise. Approve or deny rx. Pt has appt. 08-26-15

## 2015-08-26 ENCOUNTER — Encounter: Payer: Self-pay | Admitting: Obstetrics and Gynecology

## 2015-08-26 ENCOUNTER — Ambulatory Visit (INDEPENDENT_AMBULATORY_CARE_PROVIDER_SITE_OTHER): Payer: Federal, State, Local not specified - PPO | Admitting: Obstetrics and Gynecology

## 2015-08-26 VITALS — BP 118/78 | HR 60 | Resp 14 | Ht 59.0 in | Wt 217.0 lb

## 2015-08-26 DIAGNOSIS — Z124 Encounter for screening for malignant neoplasm of cervix: Secondary | ICD-10-CM | POA: Diagnosis not present

## 2015-08-26 DIAGNOSIS — Z0000 Encounter for general adult medical examination without abnormal findings: Secondary | ICD-10-CM

## 2015-08-26 DIAGNOSIS — Z01419 Encounter for gynecological examination (general) (routine) without abnormal findings: Secondary | ICD-10-CM

## 2015-08-26 DIAGNOSIS — Z113 Encounter for screening for infections with a predominantly sexual mode of transmission: Secondary | ICD-10-CM

## 2015-08-26 DIAGNOSIS — Z8742 Personal history of other diseases of the female genital tract: Secondary | ICD-10-CM

## 2015-08-26 DIAGNOSIS — Z3041 Encounter for surveillance of contraceptive pills: Secondary | ICD-10-CM | POA: Diagnosis not present

## 2015-08-26 DIAGNOSIS — Z23 Encounter for immunization: Secondary | ICD-10-CM

## 2015-08-26 MED ORDER — DESOGESTREL-ETHINYL ESTRADIOL 0.15-0.02/0.01 MG (21/5) PO TABS: 1.0000 | ORAL_TABLET | Freq: Every day | ORAL | Status: DC

## 2015-08-26 NOTE — Progress Notes (Signed)
Patient ID: Yolanda Callahan, female   DOB: 03-Jan-1991, 25 y.o.   MRN: [id number] 25 y.o. G0P0 SingleAfrican AmericanF here for annual exam.  On OCP's with normal menses. Sexually active, same partner for a year.  Period Cycle (Days): 28 Period Duration (Days): 3 days  Period Pattern: Regular Menstrual Flow: Moderate Menstrual Control: Tampon, Maxi pad Dysmenorrhea: None  She can saturate a super tampon in 5-6 hours.   Patient's last menstrual period was 08/12/2015.          Sexually active: Yes.    The current method of family planning is OCP (estrogen/progesterone).    Exercising: Yes.    cardio/ gym Smoker:  Former smoker  Health Maintenance: Pap:  01-17-14 LGSIL CIN I History of abnormal Pap:  yes TDaP:  Unsure  Gardasil: did not complete all 3, due for 1 more   reports that she has quit smoking. She has never used smokeless tobacco. She reports that she drinks about 3.0 oz of alcohol per week. She reports that she does not use illicit drugs.  Past Medical History  Diagnosis Date  . Asthma   . Seasonal allergies   . Polycystic ovarian disease   . CIN I (cervical intraepithelial neoplasia I) 2012  . Anxiety   . Depression   . Chlamydia contact, treated 07/2013    treated 2014, 2015    Past Surgical History  Procedure Laterality Date  . Wisdom tooth extraction  06/2013  . Colposcopy  2012  . Mouth surgery  2008    Current Outpatient Prescriptions  Medication Sig Dispense Refill  . albuterol (PROVENTIL HFA;VENTOLIN HFA) 108 (90 BASE) MCG/ACT inhaler Inhale 2 puffs into the lungs every 4 (four) hours as needed for wheezing. 1 Inhaler 2  . buPROPion (WELLBUTRIN XL) 300 MG 24 hr tablet Take 1 tablet (300 mg total) by mouth every morning. 30 tablet 3  . busPIRone (BUSPAR) 10 MG tablet Take 1 tablet (10 mg total) by mouth 2 (two) times daily. 60 tablet 3  . desogestrel-ethinyl estradiol (VIORELE) 0.15-0.02/0.01 MG (21/5) tablet Take 1 tablet by mouth daily. 3 Package 0  .  Multiple Vitamin (MULTIVITAMIN) tablet Take 1 tablet by mouth daily.     No current facility-administered medications for this visit.    Family History  Problem Relation Age of Onset  . Anxiety disorder Father   . Hypertension Father   . Bipolar disorder Father   . Anxiety disorder Paternal Grandmother   . Depression Paternal Grandmother   . Diabetes Paternal Grandmother   . Hypertension Mother   . Endometriosis Mother   . Heart disease Maternal Grandmother   . Fibroids Maternal Grandmother   . Osteoporosis Maternal Grandmother   . Diabetes Paternal Grandfather   . Hypertension Maternal Grandfather     Review of Systems  Constitutional: Negative.   HENT: Negative.   Eyes: Negative.   Respiratory: Negative.   Cardiovascular: Negative.   Gastrointestinal: Negative.   Endocrine: Negative.   Genitourinary: Negative.   Musculoskeletal: Negative.   Skin: Negative.   Allergic/Immunologic: Negative.   Neurological: Negative.   Psychiatric/Behavioral: Negative.     Exam:   BP 118/78 mmHg  Pulse 60  Resp 14  Ht  (1.499 m)  Wt 217 lb (98.431 kg)  BMI 43.81 kg/m2  LMP 08/12/2015  Weight change: @ Height:   Height:  (149.9 cm)  Ht Readings from Last 3 Encounters:  08/26/15  (1.499 m)  05/13/15  (1.549 m)  05/14/14  4' 11.75" (1.518 m)    General appearance: alert, cooperative and appears stated age Head: Normocephalic, without obvious abnormality, atraumatic Neck: no adenopathy, supple, symmetrical, trachea midline and thyroid normal to inspection and palpation Lungs: clear to auscultation bilaterally Breasts: normal appearance, no masses or tenderness Heart: regular rate and rhythm Abdomen: soft, non-tender; bowel sounds normal; no masses,  no organomegaly Extremities: extremities normal, atraumatic, no cyanosis or edema Skin: Skin color, texture, turgor normal. No rashes or lesions Lymph nodes: Cervical, supraclavicular, and  axillary nodes normal. No abnormal inguinal nodes palpated Neurologic: Grossly normal   Pelvic: External genitalia:  no lesions              Urethra:  normal appearing urethra with no masses, tenderness or lesions              Bartholins and Skenes: normal                 Vagina: normal appearing vagina with normal color and discharge, no lesions              Cervix: no lesions               Bimanual Exam:  Uterus:  normal size, contour, position, consistency, mobility, non-tender              Adnexa: no mass, fullness, tenderness               Rectovaginal: Confirms               Anus:  normal sphincter tone, no lesions  Chaperone was present for exam.  A:  Well Woman with normal exam  Contraception, discussed options, including the nuvaring, the nexplanon and IUD's. Currently would like to continue on OCP's  H/O LSIL  P:   Continue OCP's, aware of risks  Pap with reflex hpv  STD testing  Cholesterol and HDL  Discussed breast self exams  Discussed calcium and vit d

## 2015-08-26 NOTE — Addendum Note (Signed)
Addended by: Shelda Jakes E on: 08/26/2015 04:54 PM   Modules accepted: Orders

## 2015-08-26 NOTE — Patient Instructions (Signed)
EXERCISE AND DIET:  We recommended that you start or continue a regular exercise program for good health. Regular exercise means any activity that makes your heart beat faster and makes you sweat.  We recommend exercising at least 30 minutes per day at least 3 days a week, preferably 4 or 5.  We also recommend a diet low in fat and sugar.  Inactivity, poor dietary choices and obesity can cause diabetes, heart attack, stroke, and kidney damage, among others.    ALCOHOL AND SMOKING:  Women should limit their alcohol intake to no more than 7 drinks/beers/glasses of wine (combined, not each!) per week. Moderation of alcohol intake to this level decreases your risk of breast cancer and liver damage. And of course, no recreational drugs are part of a healthy lifestyle.  And absolutely no smoking or even second hand smoke. Most people know smoking can cause heart and lung diseases, but did you know it also contributes to weakening of your bones? Aging of your skin?  Yellowing of your teeth and nails?  CALCIUM AND VITAMIN D:  Adequate intake of calcium and Vitamin D are recommended.  The recommendations for exact amounts of these supplements seem to change often, but generally speaking 600 mg of calcium (either carbonate or citrate) and 800 units of Vitamin D per day seems prudent. Certain women may benefit from higher intake of Vitamin D.  If you are among these women, your doctor will have told you during your visit.    PAP SMEARS:  Pap smears, to check for cervical cancer or precancers,  have traditionally been done yearly, although recent scientific advances have shown that most women can have pap smears less often.  However, every woman still should have a physical exam from her gynecologist every year. It will include a breast check, inspection of the vulva and vagina to check for abnormal growths or skin changes, a visual exam of the cervix, and then an exam to evaluate the size and shape of the uterus and  ovaries.  And after 23 years of age, a rectal exam is indicated to check for rectal cancers. We will also provide age appropriate advice regarding health maintenance, like when you should have certain vaccines, screening for sexually transmitted diseases, bone density testing, colonoscopy, mammograms, etc.      

## 2015-08-27 LAB — HDL CHOLESTEROL: HDL: 59 mg/dL (ref >=46)

## 2015-08-27 LAB — STD PANEL
HIV 1&2 Ab, 4th Generation: NONREACTIVE
Hepatitis B Surface Ag: NEGATIVE

## 2015-08-27 LAB — CHOLESTEROL, TOTAL: Cholesterol: 163 mg/dL (ref 125–200)

## 2015-08-27 LAB — HEPATITIS C ANTIBODY: HCV Ab: NEGATIVE

## 2015-08-28 ENCOUNTER — Ambulatory Visit (INDEPENDENT_AMBULATORY_CARE_PROVIDER_SITE_OTHER): Payer: Federal, State, Local not specified - PPO | Admitting: Family Medicine

## 2015-08-28 VITALS — BP 124/80 | HR 84 | Temp 98.8°F | Resp 20 | Ht 61.0 in | Wt 211.0 lb

## 2015-08-28 DIAGNOSIS — J45909 Unspecified asthma, uncomplicated: Secondary | ICD-10-CM

## 2015-08-28 DIAGNOSIS — R05 Cough: Secondary | ICD-10-CM

## 2015-08-28 DIAGNOSIS — R059 Cough, unspecified: Secondary | ICD-10-CM

## 2015-08-28 DIAGNOSIS — R6883 Chills (without fever): Secondary | ICD-10-CM | POA: Diagnosis not present

## 2015-08-28 DIAGNOSIS — J069 Acute upper respiratory infection, unspecified: Secondary | ICD-10-CM | POA: Diagnosis not present

## 2015-08-28 DIAGNOSIS — R6889 Other general symptoms and signs: Secondary | ICD-10-CM

## 2015-08-28 LAB — POCT INFLUENZA A/B
Influenza A, POC: NEGATIVE
Influenza B, POC: NEGATIVE

## 2015-08-28 LAB — POCT RAPID STREP A (OFFICE): Rapid Strep A Screen: NEGATIVE

## 2015-08-28 MED ORDER — OSELTAMIVIR PHOSPHATE 75 MG PO CAPS: 75.0000 mg | ORAL_CAPSULE | Freq: Two times a day (BID) | ORAL | Status: DC

## 2015-08-28 NOTE — Progress Notes (Signed)
Chief Complaint:  Chief Complaint  Patient presents with  . Cough    x 1 month  . Hoarse    HPI: Yolanda Callahan is a 25 y.o. female who reports to [facility] today complaining of dry cough, chills, achey feeling, runny nose, and sneezing for last 24 hours, but the dry cough she has had for last 1 month. She ahs ahd to use inhaler 2times a day usually none at all, it does helps She did not get the flu vaccine.  She has a hx of asthma. She has had to use her inhaler a little more often. Has allergies so always has sinus issues, she denies fevers but had msk aches and chills. She is around children   Past Medical History  Diagnosis Date  . Asthma   . Seasonal allergies   . Polycystic ovarian disease   . CIN I (cervical intraepithelial neoplasia I) 2012  . Anxiety   . Depression   . Chlamydia contact, treated 07/2013    treated 2014, 2015   Past Surgical History  Procedure Laterality Date  . Wisdom tooth extraction  06/2013  . Colposcopy  2012  . Mouth surgery  2008   Social History   Social History  . Marital Status: Single    Spouse Name: N/A  . Number of Children: N/A  . Years of Education: N/A   Social History Main Topics  . Smoking status: Former Games developer  . Smokeless tobacco: Never Used  . Alcohol Use: 3.0 oz/week    3 Standard drinks or equivalent, 2 Cans of beer per week     Comment: Patient states she drinks 23 oz beer per week  . Drug Use: No  . Sexual Activity:    Partners: Male    Birth Control/ Protection: None   Other Topics Concern  . None   Social History Narrative   Family History  Problem Relation Age of Onset  . Anxiety disorder Father   . Hypertension Father   . Bipolar disorder Father   . Anxiety disorder Paternal Grandmother   . Depression Paternal Grandmother   . Diabetes Paternal Grandmother   . Hypertension Mother   . Endometriosis Mother   . Heart disease Maternal Grandmother   . Fibroids Maternal Grandmother   . Osteoporosis  Maternal Grandmother   . Diabetes Paternal Grandfather   . Hypertension Maternal Grandfather    Allergies  Allergen Reactions  . Penicillins Hives   Prior to Admission medications   Medication Sig Start Date End Date Taking? Authorizing Provider  albuterol (PROVENTIL HFA;VENTOLIN HFA) 108 (90 BASE) MCG/ACT inhaler Inhale 2 puffs into the lungs every 4 (four) hours as needed for wheezing. 05/13/15  Yes Elvina Sidle, MD  buPROPion (WELLBUTRIN XL) 300 MG 24 hr tablet Take 1 tablet (300 mg total) by mouth every morning. 04/23/14  Yes Nelly Rout, MD  busPIRone (BUSPAR) 10 MG tablet Take 1 tablet (10 mg total) by mouth 2 (two) times daily. 04/23/14  Yes Nelly Rout, MD  desogestrel-ethinyl estradiol (VIORELE) 0.15-0.02/0.01 MG (21/5) tablet Take 1 tablet by mouth daily. 08/26/15  Yes Romualdo Bolk, MD  Multiple Vitamin (MULTIVITAMIN) tablet Take 1 tablet by mouth daily.   Yes Historical Provider, MD     ROS: The patient denies  night sweats, unintentional weight loss, chest pain, palpitations, wheezing, dyspnea on exertion, nausea, vomiting, abdominal pain, dysuria, hematuria, melena, numbness, weakness, or tingling.   All other systems have been reviewed and were otherwise  negative with the exception of those mentioned in the HPI and as above.    PHYSICAL EXAM: Filed Vitals:   08/28/15 1438  BP: 124/80  Pulse: 84  Temp: 98.8 F (37.1 C)  Resp: 20   Body mass index is 39.89 kg/(m^2).   General: Alert, no acute distress HEENT:  Normocephalic, atraumatic, oropharynx patent. EOMI, PERRLA Erythematous throat, no exudates, TM normal, neg sinus tenderness, + erythematous/boggy nasal mucosa Cardiovascular:  Regular rate and rhythm, no rubs murmurs or gallops.   Respiratory: Clear to auscultation bilaterally.  No wheezes, rales, or rhonchi.  No cyanosis, no use of accessory musculature Abdominal: No organomegaly, abdomen is soft and non-tender, positive bowel sounds. No  masses. Skin: No rashes. Neurologic: Facial musculature symmetric. Psychiatric: Patient acts appropriately throughout our interaction. Lymphatic: No cervical or submandibular lymphadenopathy Musculoskeletal: Gait intact. No edema, tenderness   LABS: Results for orders placed or performed in visit on 08/28/15  POCT rapid strep A  Result Value Ref Range   Rapid Strep A Screen Negative Negative  POCT Influenza A/B  Result Value Ref Range   Influenza A, POC Negative Negative   Influenza B, POC Negative Negative     EKG/XRAY:   Primary read interpreted by Dr. Conley Rolls at [facility].   ASSESSMENT/PLAN: Encounter Diagnoses  Name Primary?  . Chills (without fever)   . Flu-like symptoms Yes  . Acute URI   . Cough   . Asthma, unspecified asthma severity, uncomplicated    25 y/o female with flu like sxs for less than 24 hrs,  PMH of asthma If  worsenign chills and msk aches and subjective fevers then advise to go ahead and take tamiflu within 24 hrs.  She will cont with inhaler as needed Throat cx pending Fu prn    Gross sideeffects, risk and benefits, and alternatives of medications d/w patient. Patient is aware that all medications have potential sideeffects and we are unable to predict every sideeffect or drug-drug interaction that may occur.  Thao Le DO  08/28/2015 4:12 PM

## 2015-08-29 LAB — IPS N GONORRHOEA AND CHLAMYDIA BY PCR

## 2015-08-29 LAB — IPS PAP TEST WITH REFLEX TO HPV

## 2015-08-30 ENCOUNTER — Telehealth: Payer: Self-pay | Admitting: Emergency Medicine

## 2015-08-30 DIAGNOSIS — IMO0002 Reserved for concepts with insufficient information to code with codable children: Secondary | ICD-10-CM

## 2015-08-30 LAB — CULTURE, GROUP A STREP: Organism ID, Bacteria: NORMAL

## 2015-08-30 MED ORDER — AZITHROMYCIN 500 MG PO TABS: ORAL_TABLET | ORAL | Status: DC

## 2015-08-30 NOTE — Telephone Encounter (Signed)
Call to patient. She is advised of pap smear and STD testing results per Dr. Oscar La.   Message from provider given and advised of positive Chlamydia Results. Advised rx was sent to pharmacy, should take treatment as directed, ensure to take with food. Azithromycin 1 gram PO take at once with food sent to pharmacy of choice.  Advised will need to complete treatment and notify partner(s). Stressed need to notify partner and abstain until treatment. To wait seven days after they have completed treatment. Stressed consistent condom use.   Scheduled appointment for 3 month test of cure.  Rescreen scheduled for 12/02/15 at 1530. Advised reportable condition and that report would be sent to health department.Confidential communicable disease report-Part one completed and faxed to [facility], form [facility], faxed with fax confirmation received and original sent to medical records.   Patient notified of pap smear results: [facility]. Advised colposcopy indicated again. She is agreeable. Advised needs to complete treatment for Chlamydia before colposcopy.  Colposcopy scheduled for 09/23/15 at 1300 with Dr. Oscar La. Patient on OCP-LMP 08/12/15.   Instructions given. Motrin 800 mg po x  one hour before appointment with food. Make sure to eat a meal before appointment and drink plenty of fluids. Patient verbalized understanding and will call to reschedule if will be on menses or has any concerns regarding pregnancy.    Patient did not have any questions regarding testing/treatment at this time. Advised to call back with any, she is agreeable and verbalized understanding for very important need for treatment and follow up.   Dr. Oscar La, okay as scheduled for colposcopy 09/23/15?

## 2015-08-30 NOTE — Telephone Encounter (Signed)
Patient returning your call [phone].

## 2015-08-30 NOTE — Telephone Encounter (Signed)
Message left to return call to Glynn Freas at [phone].    

## 2015-08-30 NOTE — Telephone Encounter (Signed)
-----   Message from Romualdo Bolk, MD sent at 08/30/2015  9:32 AM EST ----- This is at least the patient's 3rd abnormal pap, she needs another colposcopy. She also has chlamydia, she has had it before. She needs to be treated with 1 gram of Azithromycin po now. Her partner also needs to be treated. No intercourse until one week after they both have been treated. Her other std testing was negative. Encourage condom use, long term. She will need retesting for chlamydia in 3 months, please set up an appointment. Thanks!

## 2015-09-01 NOTE — Telephone Encounter (Signed)
Yes, colposcopy on 09/23/15 is fine. Thanks!

## 2015-09-04 ENCOUNTER — Encounter: Payer: Self-pay | Admitting: *Deleted

## 2015-09-04 NOTE — Telephone Encounter (Signed)
Noted message from Dr. Oscar La. Will close encounter.

## 2015-09-13 ENCOUNTER — Ambulatory Visit (INDEPENDENT_AMBULATORY_CARE_PROVIDER_SITE_OTHER): Payer: Federal, State, Local not specified - PPO | Admitting: Family Medicine

## 2015-09-13 VITALS — BP 110/84 | Temp 102.4°F | Resp 16 | Ht 60.5 in | Wt 210.8 lb

## 2015-09-13 DIAGNOSIS — J111 Influenza due to unidentified influenza virus with other respiratory manifestations: Secondary | ICD-10-CM

## 2015-09-13 MED ORDER — OSELTAMIVIR PHOSPHATE 75 MG PO CAPS: 75.0000 mg | ORAL_CAPSULE | Freq: Two times a day (BID) | ORAL | Status: DC

## 2015-09-13 MED ORDER — IBUPROFEN 200 MG PO TABS
200.0000 mg | ORAL_TABLET | Freq: Once | ORAL | Status: AC
  Administered 2015-09-13: 200 mg via ORAL

## 2015-09-13 MED ORDER — HYDROCODONE-HOMATROPINE 5-1.5 MG/5ML PO SYRP: 5.0000 mL | ORAL_SOLUTION | Freq: Three times a day (TID) | ORAL | Status: DC | PRN

## 2015-09-13 NOTE — Addendum Note (Signed)
Addended by: Anselm Pancoast R on: 09/13/2015 02:52 PM   Modules accepted: Orders

## 2015-09-13 NOTE — Patient Instructions (Signed)

## 2015-09-13 NOTE — Progress Notes (Signed)
 @  By signing my name below, I, Raven Small, attest that this documentation has been prepared under the direction and in the presence of Elvina Sidle, MD.  Electronically Signed: Andrew Au, ED Scribe. 09/13/2015. 2:45 PM.  Patient ID: Yolanda Callahan MRN: [id number], DOB: 06-Aug-1990, 25 y.o. Date of Encounter: 09/13/2015, 2:44 PM  Primary Physician: No PCP Per Patient  Chief Complaint:  Chief Complaint  Patient presents with  . fever    symptoms started 3 days  . chills    started on 09/12/2015  . Fatigue  . Cough    HPI: 25 y.o. year old female with history below presents with . Pt states symptoms started 3 days ago with fatigue. As the week progressed fatigue worsened and she developed chills, fever and harsh productive cough. Pt works at an [employer] and reports sick contacts at work. She denies nausea and emesis.    Past Medical History  Diagnosis Date  . Asthma   . Seasonal allergies   . Polycystic ovarian disease   . CIN I (cervical intraepithelial neoplasia I) 2012  . Anxiety   . Depression   . Chlamydia contact, treated 07/2013    treated 2014, 2015     Home Meds: Prior to Admission medications   Medication Sig Start Date End Date Taking? Authorizing Provider  albuterol (PROVENTIL HFA;VENTOLIN HFA) 108 (90 BASE) MCG/ACT inhaler Inhale 2 puffs into the lungs every 4 (four) hours as needed for wheezing. 05/13/15  Yes Elvina Sidle, MD  buPROPion (WELLBUTRIN XL) 300 MG 24 hr tablet Take 1 tablet (300 mg total) by mouth every morning. 04/23/14  Yes Nelly Rout, MD  busPIRone (BUSPAR) 10 MG tablet Take 1 tablet (10 mg total) by mouth 2 (two) times daily. 04/23/14  Yes Nelly Rout, MD  desogestrel-ethinyl estradiol (VIORELE) 0.15-0.02/0.01 MG (21/5) tablet Take 1 tablet by mouth daily. 08/26/15  Yes Romualdo Bolk, MD  Multiple Vitamin (MULTIVITAMIN) tablet Take 1 tablet by mouth daily.   Yes Historical Provider, MD  oseltamivir (TAMIFLU) 75 MG  capsule Take 1 capsule (75 mg total) by mouth 2 (two) times daily. Patient not taking: Reported on 09/13/2015 08/28/15   Thao P Le, DO    Allergies:  Allergies  Allergen Reactions  . Penicillins Hives    Social History   Social History  . Marital Status: Single    Spouse Name: N/A  . Number of Children: N/A  . Years of Education: N/A   Occupational History  . Not on file.   Social History Main Topics  . Smoking status: Former Games developer  . Smokeless tobacco: Never Used  . Alcohol Use: 3.0 oz/week    3 Standard drinks or equivalent, 2 Cans of beer per week     Comment: Patient states she drinks 23 oz beer per week  . Drug Use: No  . Sexual Activity:    Partners: Male    Birth Control/ Protection: None   Other Topics Concern  . Not on file   Social History Narrative     Review of Systems: Constitutional: negative for night sweats or weight changes. HEENT: negative for vision changes, hearing loss, congestion, rhinorrhea, ST, epistaxis, or sinus pressure Cardiovascular: negative for chest pain or palpitations Respiratory: negative for hemoptysis, wheezing, or shortness of breath. Abdominal: negative for abdominal pain, nausea, vomiting, diarrhea, or constipation Dermatological: negative for rash Neurologic: negative for headache, dizziness, or syncope All other systems reviewed and are otherwise negative with the exception to those above and  in the HPI.   Physical Exam: Blood pressure 110/84, temperature 102.4 F (39.1 C), temperature source Oral, resp. rate 16, height 5' 0.5" (1.537 m), weight 210 lb 12.8 oz (95.618 kg), last menstrual period 08/12/2015., Body mass index is 40.48 kg/(m^2). General: Well developed, well nourished, in no acute distress. Head: Normocephalic, atraumatic, eyes without discharge, sclera non-icteric, nares are without discharge. Bilateral auditory canals clear, TM's are without perforation, pearly grey and translucent with reflective cone of  light bilaterally. Oral cavity moist, posterior pharynx without exudate, erythema, peritonsillar abscess, or post nasal drip.  Neck: Supple. No thyromegaly. Full ROM. No lymphadenopathy. Lungs:  Breathing is unlabored.congested cough with a few rhonchi.  Heart: RRR with S1 S2. No murmurs, rubs, or gallops appreciated. Msk:  Strength and tone normal for age. Extremities/Skin: Warm and dry. No clubbing or cyanosis. No edema. No rashes or suspicious lesions. Neuro: Alert and oriented X 3. Moves all extremities spontaneously. Gait is normal. CNII-XII grossly in tact. Psych:  Responds to questions appropriately with a normal affect.    ASSESSMENT AND PLAN:  25 y.o. year old female with influenza A This chart was scribed in my presence and reviewed by me personally.    ICD-9-CM ICD-10-CM   1. Influenza with respiratory manifestation 487.1 J11.1 oseltamivir (TAMIFLU) 75 MG capsule     HYDROcodone-homatropine (HYCODAN) 5-1.5 MG/5ML syrup      Signed, Elvina Sidle, MD 09/13/2015 2:44 PM

## 2015-09-23 ENCOUNTER — Telehealth: Payer: Self-pay | Admitting: Obstetrics and Gynecology

## 2015-09-23 ENCOUNTER — Ambulatory Visit: Payer: Federal, State, Local not specified - PPO | Admitting: Obstetrics and Gynecology

## 2015-09-23 NOTE — Telephone Encounter (Signed)
Left message to call Glorie Dowlen at [phone]. 

## 2015-09-23 NOTE — Telephone Encounter (Signed)
Patient called to cancel colpo appointment today because she started her cycle.

## 2015-09-26 NOTE — Telephone Encounter (Signed)
Left message to call Doyce Stonehouse at [phone]. 

## 2015-09-30 NOTE — Telephone Encounter (Signed)
Yes, please send a letter.  Thank you

## 2015-09-30 NOTE — Telephone Encounter (Signed)
Dr.Jertson, I have attempted to reach this patient x2 with no return call regarding rescheduling her colposcopy. Would you like me to send the patient a letter at this time?

## 2015-10-01 NOTE — Telephone Encounter (Signed)
Letter written and to Dr.Jertson for review and signature prior to sending. 

## 2015-10-02 NOTE — Telephone Encounter (Signed)
Letter signed by Dr.Jertson. Letter to Starla to be sent certified and given back to Billie RuddySally Yeakley, RN to hold.  Cc: Billie RuddySally Yeakley, RN  Routing to provider for final review. Patient agreeable to disposition. Will close encounter.

## 2015-12-02 ENCOUNTER — Ambulatory Visit: Payer: Federal, State, Local not specified - PPO | Admitting: Obstetrics and Gynecology

## 2016-01-28 DIAGNOSIS — F411 Generalized anxiety disorder: Secondary | ICD-10-CM | POA: Diagnosis not present

## 2016-01-28 DIAGNOSIS — F331 Major depressive disorder, recurrent, moderate: Secondary | ICD-10-CM | POA: Diagnosis not present

## 2016-02-18 DIAGNOSIS — F331 Major depressive disorder, recurrent, moderate: Secondary | ICD-10-CM | POA: Diagnosis not present

## 2016-02-18 DIAGNOSIS — F411 Generalized anxiety disorder: Secondary | ICD-10-CM | POA: Diagnosis not present

## 2016-03-04 DIAGNOSIS — F331 Major depressive disorder, recurrent, moderate: Secondary | ICD-10-CM | POA: Diagnosis not present

## 2016-03-04 DIAGNOSIS — F411 Generalized anxiety disorder: Secondary | ICD-10-CM | POA: Diagnosis not present

## 2016-04-15 DIAGNOSIS — F331 Major depressive disorder, recurrent, moderate: Secondary | ICD-10-CM | POA: Diagnosis not present

## 2016-04-15 DIAGNOSIS — F411 Generalized anxiety disorder: Secondary | ICD-10-CM | POA: Diagnosis not present

## 2016-05-27 DIAGNOSIS — F411 Generalized anxiety disorder: Secondary | ICD-10-CM | POA: Diagnosis not present

## 2016-05-27 DIAGNOSIS — F331 Major depressive disorder, recurrent, moderate: Secondary | ICD-10-CM | POA: Diagnosis not present

## 2016-06-18 ENCOUNTER — Ambulatory Visit (INDEPENDENT_AMBULATORY_CARE_PROVIDER_SITE_OTHER): Payer: Federal, State, Local not specified - PPO | Admitting: Family Medicine

## 2016-06-18 VITALS — BP 122/80 | HR 85 | Temp 98.3°F | Resp 17 | Ht 60.5 in | Wt 220.0 lb

## 2016-06-18 DIAGNOSIS — Z0000 Encounter for general adult medical examination without abnormal findings: Secondary | ICD-10-CM | POA: Diagnosis not present

## 2016-06-18 DIAGNOSIS — Z111 Encounter for screening for respiratory tuberculosis: Secondary | ICD-10-CM | POA: Diagnosis not present

## 2016-06-18 DIAGNOSIS — Z23 Encounter for immunization: Secondary | ICD-10-CM | POA: Diagnosis not present

## 2016-06-18 NOTE — Progress Notes (Signed)
Chief Complaint  Patient presents with  . Annual Exam    For work     Subjective:  Yolanda Callahan is a 25 y.o. female here for a health maintenance visit.  Patient is established pt She needs a full physical and clearance for work with the [employer].  Patient Active Problem List   Diagnosis Date Noted  . H/O chlamydia infection 01/17/2014  . MDD (major depressive disorder) 10/27/2012  . CIN I (cervical intraepithelial neoplasia I)   . GAD (generalized anxiety disorder) 07/23/2011    Past Medical History:  Diagnosis Date  . Anxiety   . Asthma   . Chlamydia contact, treated 07/2013   treated 2014, 2015  . CIN I (cervical intraepithelial neoplasia I) 2012  . Depression   . Polycystic ovarian disease   . Seasonal allergies     Past Surgical History:  Procedure Laterality Date  . COLPOSCOPY  2012  . MOUTH SURGERY  2008  . WISDOM TOOTH EXTRACTION  06/2013     Outpatient Medications Prior to Visit  Medication Sig Dispense Refill  . albuterol (PROVENTIL HFA;VENTOLIN HFA) 108 (90 BASE) MCG/ACT inhaler Inhale 2 puffs into the lungs every 4 (four) hours as needed for wheezing. 1 Inhaler 2  . buPROPion (WELLBUTRIN XL) 300 MG 24 hr tablet Take 1 tablet (300 mg total) by mouth every morning. 30 tablet 3  . busPIRone (BUSPAR) 10 MG tablet Take 1 tablet (10 mg total) by mouth 2 (two) times daily. 60 tablet 3  . desogestrel-ethinyl estradiol (VIORELE) 0.15-0.02/0.01 MG (21/5) tablet Take 1 tablet by mouth daily. 3 Package 3  . Multiple Vitamin (MULTIVITAMIN) tablet Take 1 tablet by mouth daily.    Marland Kitchen HYDROcodone-homatropine (HYCODAN) 5-1.5 MG/5ML syrup Take 5 mLs by mouth every 8 (eight) hours as needed for cough. (Patient not taking: Reported on 06/18/2016) 120 mL 0  . oseltamivir (TAMIFLU) 75 MG capsule Take 1 capsule (75 mg total) by mouth 2 (two) times daily. (Patient not taking: Reported on 06/18/2016) 10 capsule 0  . oseltamivir (TAMIFLU) 75 MG capsule  Take 1 capsule (75 mg total) by mouth 2 (two) times daily. (Patient not taking: Reported on 06/18/2016) 10 capsule 0   No facility-administered medications prior to visit.     Allergies  Allergen Reactions  . Penicillins Hives     Family History  Problem Relation Age of Onset  . Anxiety disorder Father   . Hypertension Father   . Bipolar disorder Father   . Anxiety disorder Paternal Grandmother   . Depression Paternal Grandmother   . Diabetes Paternal Grandmother   . Hypertension Mother   . Endometriosis Mother   . Heart disease Maternal Grandmother   . Fibroids Maternal Grandmother   . Osteoporosis Maternal Grandmother   . Diabetes Paternal Grandfather   . Hypertension Maternal Grandfather      Health Habits: Dental Exam: not up to date Eye Exam: not up to date Exercise: 0 times/week on average Current exercise activities: walking/running Diet: fast food  Social History   Social History  . Marital status: Single    Spouse name: N/A  . Number of children: N/A  . Years of education: N/A   Occupational History  . Not on file.   Social History Main Topics  . Smoking status: Former Games developer  . Smokeless tobacco: Never Used  . Alcohol use 3.0 oz/week    3 Standard drinks or equivalent, 2 Cans of beer per week     Comment: Patient  states she drinks 23 oz beer per week  . Drug use: No  . Sexual activity: Yes    Partners: Male    Birth control/ protection: None   Other Topics Concern  . Not on file   Social History Narrative  . No narrative on file   History  Alcohol Use  . 3.0 oz/week  . 3 Standard drinks or equivalent, 2 Cans of beer per week    Comment: Patient states she drinks 23 oz beer per week   History  Smoking Status  . Former Smoker  Smokeless Tobacco  . Never Used   History  Drug Use No    GYN: Sexual Health Menstrual status: regular menses LMP: Patient's last menstrual period was 06/11/2016. Last pap smear: see HM section History  of abnormal pap smears:  Sexually active: yes with female partner Current contraception: OCP  Health Maintenance: See under health Maintenance activity for review of completion dates as well. Immunization History  Administered Date(s) Administered  . HPV Quadrivalent 01/04/2013, 01/18/2014, 08/26/2015      Depression Screen-PHQ2/9 [facility] 06/18/2016 09/13/2015 08/28/2015 05/13/2015  Decreased Interest 0 0 0 0  Down, Depressed, Hopeless 0 0 0 0  PHQ - 2 Score 0 0 0 0      Depression Severity and Treatment Recommendations:  0-4= None  5-9= Mild / Treatment: Support, educate to call if worse; return in one month  10-14= Moderate / Treatment: Support, watchful waiting; Antidepressant or Psycotherapy  15-19= Moderately severe / Treatment: Antidepressant OR Psychotherapy  >= 20 = Major depression, severe / Antidepressant AND Psychotherapy    Review of Systems   Review of Systems  Constitutional: Negative for chills, fever, malaise/fatigue and weight loss.  HENT: Negative for ear discharge, ear pain, hearing loss, nosebleeds and tinnitus.   Eyes: Negative for blurred vision, double vision, photophobia and pain.  Respiratory: Negative for cough, hemoptysis, sputum production, shortness of breath and wheezing.   Cardiovascular: Negative for chest pain, palpitations, orthopnea and claudication.  Gastrointestinal: Negative for abdominal pain, heartburn, nausea and vomiting.  Genitourinary: Negative for dysuria, flank pain, frequency, hematuria and urgency.  Musculoskeletal: Negative for back pain, myalgias and neck pain.  Skin: Negative for itching and rash.    See HPI for ROS as well.    Objective:   Vitals:   06/18/16 1626  BP: 122/80  Pulse: 85  Resp: 17  Temp: 98.3 F (36.8 C)  TempSrc: Oral  SpO2: 100%  Weight: 220 lb (99.8 kg)  Height: 5' 0.5" (1.537 m)    Body mass index is 42.26 kg/m.  Wt Readings from Last 3 Encounters:  06/18/16 220 lb  (99.8 kg)  09/13/15 210 lb 12.8 oz (95.6 kg)  08/28/15 211 lb (95.7 kg)    Physical Exam  Constitutional: She is oriented to person, place, and time. She appears well-developed and well-nourished.  HENT:  Head: Normocephalic and atraumatic.  Right Ear: External ear normal.  Left Ear: External ear normal.  Nose: Nose normal.  Mouth/Throat: Oropharynx is clear and moist.  Eyes: Conjunctivae and EOM are normal. Pupils are equal, round, and reactive to light.  Neck: Normal range of motion. Neck supple.  Cardiovascular: Normal rate, regular rhythm, normal heart sounds and intact distal pulses.   No murmur heard. Pulmonary/Chest: Effort normal and breath sounds normal. No respiratory distress. She has no wheezes. She has no rales.  Musculoskeletal: Normal range of motion. She exhibits no edema.  Neurological: She is alert and oriented  to person, place, and time.  Skin: Skin is warm. No rash noted. No erythema.  Psychiatric: She has a normal mood and affect. Her behavior is normal. Judgment and thought content normal.       Assessment/Plan:   Patient was seen for a health maintenance exam.  Counseled the patient on health maintenance issues. Reviewed her health mainteance schedule and ordered appropriate tests (see orders.) Counseled on regular exercise and weight management. Recommend regular eye exams and dental cleaning.   The following issues were addressed today for health maintenance:   Emmalina was seen today for annual exam.  Diagnoses and all orders for this visit:  Health maintenance examination- discussed age appropriate screenings Pap and std screening done by OB -     Basic metabolic panel -     Lipid panel  Flu vaccine need -     Flu Vaccine QUAD 36+ mos IM  Morbid obesity (HCC)- discussed appropriate weight loss -     Hemoglobin A1c -     Lipid panel  Screening-pulmonary TB- pt form put in the box She should return to clinic 06/20/16 at 5:10pm  -     TB Skin  Test    No Follow-up on file.    Body mass index is 42.26 kg/m.:  Discussed the patient's BMI with patient. The BMI body mass index is 42.26 kg/m.     No future appointments.  Patient Instructions     Based on your questionnionaire you are considered low risk for Tb.  A TB skin test will be performed today 06/18/2016.  Please return to clinic to have it read on 06/20/2016.    IF you received an x-ray today, you will receive an invoice from [facility]. Please contact [facility] at [phone] with questions or concerns regarding your invoice.   IF you received labwork today, you will receive an invoice from [employer] [facility]. Please contact [facility] at [phone] with questions or concerns regarding your invoice.   Our billing staff will not be able to assist you with questions regarding bills from these companies.  You will be contacted with the lab results as soon as they are available. The fastest way to get your results is to activate your My Chart account. Instructions are located on the last page of this paperwork. If you have not heard from [place] regarding the results in 2 weeks, please contact this office.      Exercising to Lose Weight Introduction Exercising can help you to lose weight. In order to lose weight through exercise, you need to do vigorous-intensity exercise. You can tell that you are exercising with vigorous intensity if you are breathing very hard and fast and cannot hold a conversation while exercising. Moderate-intensity exercise helps to maintain your current weight. You can tell that you are exercising at a moderate level if you have a higher heart rate and faster breathing, but you are still able to hold a conversation. How often should I exercise? Choose an activity that you enjoy and set realistic goals. Your health care provider can help you to make an activity plan that works for you. Exercise regularly as  directed by your health care provider. This may include:  Doing resistance training twice each week, such as:  Push-ups.  Sit-ups.  Lifting weights.  Using resistance bands.  Doing a given intensity of exercise for a given amount of time. Choose from these options:  150 minutes of moderate-intensity exercise every week.  75  minutes of vigorous-intensity exercise every week.  A mix of moderate-intensity and vigorous-intensity exercise every week. Children, pregnant women, people who are out of shape, people who are overweight, and older adults may need to consult a health care provider for individual recommendations. If you have any sort of medical condition, be sure to consult your health care provider before starting a new exercise program. What are some activities that can help me to lose weight?  Walking at a rate of at least 4.5 miles an hour.  Jogging or running at a rate of 5 miles per hour.  Biking at a rate of at least 10 miles per hour.  Lap swimming.  Roller-skating or in-line skating.  Cross-country skiing.  Vigorous competitive sports, such as football, basketball, and soccer.  Jumping rope.  Aerobic dancing. How can I be more active in my day-to-day activities?  Use the stairs instead of the elevator.  Take a walk during your lunch break.  If you drive, park your car farther away from work or school.  If you take public transportation, get off one stop early and walk the rest of the way.  Make all of your phone calls while standing up and walking around.  Get up, stretch, and walk around every 30 minutes throughout the day. What guidelines should I follow while exercising?  Do not exercise so much that you hurt yourself, feel dizzy, or get very short of breath.  Consult your health care provider prior to starting a new exercise program.  Wear comfortable clothes and shoes with good support.  Drink plenty of water while you exercise to prevent  dehydration or heat stroke. Body water is lost during exercise and must be replaced.  Work out until you breathe faster and your heart beats faster. This information is not intended to replace advice given to you by your health care provider. Make sure you discuss any questions you have with your health care provider. Document Released: 08/08/2010 Document Revised: 12/12/2015 Document Reviewed: 12/07/2013  2017 Elsevier

## 2016-06-18 NOTE — Patient Instructions (Addendum)
Based on your questionnionaire you are considered low risk for Tb.  A TB skin test will be performed today 06/18/2016.  Please return to clinic to have it read on 06/20/2016.    IF you received an x-ray today, you will receive an invoice from [facility]. Please contact [facility] at [phone] with questions or concerns regarding your invoice.   IF you received labwork today, you will receive an invoice from [facility]. Please contact [facility] at [phone] with questions or concerns regarding your invoice.   Our billing staff will not be able to assist you with questions regarding bills from these companies.  You will be contacted with the lab results as soon as they are available. The fastest way to get your results is to activate your My Chart account. Instructions are located on the last page of this paperwork. If you have not heard from us regarding the results in 2 weeks, please contact this office.      Exercising to Lose Weight Introduction Exercising can help you to lose weight. In order to lose weight through exercise, you need to do vigorous-intensity exercise. You can tell that you are exercising with vigorous intensity if you are breathing very hard and fast and cannot hold a conversation while exercising. Moderate-intensity exercise helps to maintain your current weight. You can tell that you are exercising at a moderate level if you have a higher heart rate and faster breathing, but you are still able to hold a conversation. How often should I exercise? Choose an activity that you enjoy and set realistic goals. Your health care provider can help you to make an activity plan that works for you. Exercise regularly as directed by your health care provider. This may include:  Doing resistance training twice each week, such as:  Push-ups.  Sit-ups.  Lifting weights.  Using resistance bands.  Doing a given intensity of  exercise for a given amount of time. Choose from these options:  150 minutes of moderate-intensity exercise every week.  75 minutes of vigorous-intensity exercise every week.  A mix of moderate-intensity and vigorous-intensity exercise every week. Children, pregnant women, people who are out of shape, people who are overweight, and older adults may need to consult a health care provider for individual recommendations. If you have any sort of medical condition, be sure to consult your health care provider before starting a new exercise program. What are some activities that can help me to lose weight?  Walking at a rate of at least 4.5 miles an hour.  Jogging or running at a rate of 5 miles per hour.  Biking at a rate of at least 10 miles per hour.  Lap swimming.  Roller-skating or in-line skating.  Cross-country skiing.  Vigorous competitive sports, such as football, basketball, and soccer.  Jumping rope.  Aerobic dancing. How can I be more active in my day-to-day activities?  Use the stairs instead of the elevator.  Take a walk during your lunch break.  If you drive, park your car farther away from work or school.  If you take public transportation, get off one stop early and walk the rest of the way.  Make all of your phone calls while standing up and walking around.  Get up, stretch, and walk around every 30 minutes throughout the day. What guidelines should I follow while exercising?  Do not exercise so much that you hurt yourself, feel dizzy, or get very short of breath.  Consult  your health care provider prior to starting a new exercise program.  Wear comfortable clothes and shoes with good support.  Drink plenty of water while you exercise to prevent dehydration or heat stroke. Body water is lost during exercise and must be replaced.  Work out until you breathe faster and your heart beats faster. This information is not intended to replace advice given to you  by your health care provider. Make sure you discuss any questions you have with your health care provider. Document Released: 08/08/2010 Document Revised: 12/12/2015 Document Reviewed: 12/07/2013  2017 Elsevier

## 2016-06-19 LAB — BASIC METABOLIC PANEL WITH GFR
BUN: 13 mg/dL (ref 7–25)
CO2: 25 mmol/L (ref 20–31)
Calcium: 9.4 mg/dL (ref 8.6–10.2)
Chloride: 106 mmol/L (ref 98–110)
Creat: 0.88 mg/dL (ref 0.50–1.10)
Glucose, Bld: 90 mg/dL (ref 65–99)
Potassium: 4.4 mmol/L (ref 3.5–5.3)
Sodium: 140 mmol/L (ref 135–146)

## 2016-06-19 LAB — LIPID PANEL
Cholesterol: 178 mg/dL (ref <200)
HDL: 61 mg/dL (ref >50)
LDL Cholesterol: 96 mg/dL (ref <100)
Total CHOL/HDL Ratio: 2.9 Ratio (ref <5.0)
Triglycerides: 107 mg/dL (ref <150)
VLDL: 21 mg/dL (ref <30)

## 2016-06-20 LAB — HEMOGLOBIN A1C
Hgb A1c MFr Bld: 5.3 % (ref <5.7)
Mean Plasma Glucose: 105 mg/dL

## 2016-06-23 ENCOUNTER — Ambulatory Visit (INDEPENDENT_AMBULATORY_CARE_PROVIDER_SITE_OTHER): Payer: Federal, State, Local not specified - PPO | Admitting: Family Medicine

## 2016-06-23 VITALS — BP 130/86 | HR 100 | Temp 98.4°F | Resp 20 | Ht 60.5 in | Wt 220.0 lb

## 2016-06-23 DIAGNOSIS — J4521 Mild intermittent asthma with (acute) exacerbation: Secondary | ICD-10-CM | POA: Diagnosis not present

## 2016-06-23 DIAGNOSIS — J019 Acute sinusitis, unspecified: Secondary | ICD-10-CM | POA: Diagnosis not present

## 2016-06-23 MED ORDER — ALBUTEROL SULFATE (2.5 MG/3ML) 0.083% IN NEBU
2.5000 mg | INHALATION_SOLUTION | Freq: Once | RESPIRATORY_TRACT | Status: AC
  Administered 2016-06-23: 2.5 mg via RESPIRATORY_TRACT

## 2016-06-23 MED ORDER — PREDNISONE 20 MG PO TABS: 40.0000 mg | ORAL_TABLET | Freq: Every day | ORAL | 0 refills | Status: DC

## 2016-06-23 NOTE — Patient Instructions (Signed)
     IF you received an x-ray today, you will receive an invoice from [facility]. Please contact [facility] at [phone] with questions or concerns regarding your invoice.   IF you received labwork today, you will receive an invoice from [facility]. Please contact [facility] at [phone] with questions or concerns regarding your invoice.   Our billing staff will not be able to assist you with questions regarding bills from these companies.  You will be contacted with the lab results as soon as they are available. The fastest way to get your results is to activate your My Chart account. Instructions are located on the last page of this paperwork. If you have not heard from us regarding the results in 2 weeks, please contact this office.      

## 2016-06-23 NOTE — Progress Notes (Signed)
By signing my name below, I, Mesha Guinyard, attest that this documentation has been prepared under the direction and in the presence of Norberto SorensonEva Dayyan Krist, MD.  Electronically Signed: Arvilla MarketMesha Guinyard, Medical Scribe. 06/23/16. 6:15 PM.  Subjective:    Patient ID: Yolanda MayersBria L Arseneault, female    DOB: 09-11-1990, 25 y.o.   MRN: [id number]  HPI Chief Complaint  Patient presents with  . Cough    chest congestion  x 4 days  . Wheezing  . Shortness of Breath  . Chills    HPI Comments: Yolanda MayersBria L Franssen is a 25 y.o. female who presents to the [facility] complaining of cough onset 11/23. It initially got better but she started coughing 3 days ago. Reports associated sxs of chills, sinus pressure, nasal congestion with drainage, chest tightness, and SOB. Pt used her inhaler 5x today without relief of her sxs as well as vicks vapor rub, and mucinex without relief. Denies wheezing and fever.  Patient Active Problem List   Diagnosis Date Noted  . H/O chlamydia infection 01/17/2014  . MDD (major depressive disorder) 10/27/2012  . CIN I (cervical intraepithelial neoplasia I)   . GAD (generalized anxiety disorder) 07/23/2011   Past Medical History:  Diagnosis Date  . Anxiety   . Asthma   . Chlamydia contact, treated 07/2013   treated 2014, 2015  . CIN I (cervical intraepithelial neoplasia I) 2012  . Depression   . Polycystic ovarian disease   . Seasonal allergies    Past Surgical History:  Procedure Laterality Date  . COLPOSCOPY  2012  . MOUTH SURGERY  2008  . WISDOM TOOTH EXTRACTION  06/2013   Allergies  Allergen Reactions  . Penicillins Hives   Prior to Admission medications   Medication Sig Start Date End Date Taking? Authorizing Provider  albuterol (PROVENTIL HFA;VENTOLIN HFA) 108 (90 BASE) MCG/ACT inhaler Inhale 2 puffs into the lungs every 4 (four) hours as needed for wheezing. 05/13/15  Yes Elvina SidleKurt Lauenstein, MD  buPROPion (WELLBUTRIN XL) 300 MG 24 hr tablet Take 1  tablet (300 mg total) by mouth every morning. 04/23/14  Yes Nelly RoutArchana Kumar, MD  busPIRone (BUSPAR) 10 MG tablet Take 1 tablet (10 mg total) by mouth 2 (two) times daily. 04/23/14  Yes Nelly RoutArchana Kumar, MD  desogestrel-ethinyl estradiol (VIORELE) 0.15-0.02/0.01 MG (21/5) tablet Take 1 tablet by mouth daily. 08/26/15  Yes Romualdo BolkJill Evelyn Jertson, MD  Multiple Vitamin (MULTIVITAMIN) tablet Take 1 tablet by mouth daily.   Yes Historical Provider, MD   Social History   Social History  . Marital status: Single    Spouse name: N/A  . Number of children: N/A  . Years of education: N/A   Occupational History  . Not on file.   Social History Main Topics  . Smoking status: Former Games developermoker  . Smokeless tobacco: Never Used  . Alcohol use 3.0 oz/week    3 Standard drinks or equivalent, 2 Cans of beer per week     Comment: Patient states she drinks 23 oz beer per week  . Drug use: No  . Sexual activity: Yes    Partners: Male    Birth control/ protection: None   Other Topics Concern  . Not on file   Social History Narrative  . No narrative on file   Review of Systems  Constitutional: Positive for chills. Negative for fever.  HENT: Positive for congestion and sinus pressure.   Respiratory: Positive for cough, chest tightness and shortness of breath. Negative for  wheezing.    Objective:  Physical Exam  Constitutional: She appears well-developed and well-nourished. No distress.  HENT:  Head: Normocephalic and atraumatic.  Right Ear: Tympanic membrane is erythematous.  Left Ear: Tympanic membrane normal.  Right nare completely swollen  Eyes: Conjunctivae are normal.  Neck: Neck supple.  Cardiovascular: Regular rhythm, S1 normal and S2 normal.  Tachycardia present.  Exam reveals no friction rub.   No murmur heard. Pulmonary/Chest: Effort normal. No respiratory distress. She has decreased breath sounds. She has no wheezes. She has no rhonchi. She has no rales.  Barking hacking cough  Lymphadenopathy:     She has no cervical adenopathy.  Neurological: She is alert.  Skin: Skin is warm and dry.  Psychiatric: She has a normal mood and affect. Her behavior is normal.  Nursing note and vitals reviewed.   BP 130/86 (BP Location: Right Arm, Patient Position: Sitting, Cuff Size: Large)   Pulse 100   Temp 98.4 F (36.9 C) (Oral)   Resp 20   Ht 5' 0.5" (1.537 m)   Wt 220 lb (99.8 kg)   LMP 06/11/2016   SpO2 100%   BMI 42.26 kg/m    Sxs improved and Pulm exam normal after alb neb in office x 1 today.  Assessment & Plan:   1. Acute non-recurrent sinusitis, unspecified location - cont sched freq albuterol inh use  2. Mild intermittent asthma with acute exacerbation - start cefdinir     Meds ordered this encounter  Medications  . albuterol (PROVENTIL) (2.5 MG/3ML) 0.083% nebulizer solution 2.5 mg  . predniSONE (DELTASONE) 20 MG tablet    Sig: Take 2 tablets (40 mg total) by mouth daily with breakfast.    Dispense:  10 tablet    Refill:  0    I personally performed the services described in this documentation, which was scribed in my presence. The recorded information has been reviewed and considered, and addended by me as needed.   Norberto SorensonEva Abdinasir Spadafore, M.D.  [facility]  [place] [address] [place], [place] [address] [phone] phone [phone] fax  06/24/16 12:18 AM

## 2016-06-24 MED ORDER — CEFDINIR 300 MG PO CAPS: 600.0000 mg | ORAL_CAPSULE | Freq: Every day | ORAL | 0 refills | Status: DC

## 2016-08-01 ENCOUNTER — Other Ambulatory Visit: Payer: Self-pay | Admitting: Obstetrics and Gynecology

## 2016-08-01 DIAGNOSIS — Z3041 Encounter for surveillance of contraceptive pills: Secondary | ICD-10-CM

## 2016-08-19 DIAGNOSIS — F411 Generalized anxiety disorder: Secondary | ICD-10-CM | POA: Diagnosis not present

## 2016-08-19 DIAGNOSIS — F331 Major depressive disorder, recurrent, moderate: Secondary | ICD-10-CM | POA: Diagnosis not present

## 2016-09-09 ENCOUNTER — Other Ambulatory Visit: Payer: Self-pay | Admitting: Obstetrics and Gynecology

## 2016-09-09 ENCOUNTER — Other Ambulatory Visit (HOSPITAL_COMMUNITY)
Admission: RE | Admit: 2016-09-09 | Discharge: 2016-09-09 | Disposition: A | Discharge status: Home / Self Care | Payer: Federal, State, Local not specified - PPO | Source: Ambulatory Visit | Attending: Obstetrics and Gynecology | Admitting: Obstetrics and Gynecology

## 2016-09-09 DIAGNOSIS — Z1151 Encounter for screening for human papillomavirus (HPV): Secondary | ICD-10-CM | POA: Diagnosis not present

## 2016-09-09 DIAGNOSIS — Z113 Encounter for screening for infections with a predominantly sexual mode of transmission: Secondary | ICD-10-CM | POA: Diagnosis not present

## 2016-09-09 DIAGNOSIS — Z01419 Encounter for gynecological examination (general) (routine) without abnormal findings: Secondary | ICD-10-CM | POA: Diagnosis not present

## 2016-09-09 DIAGNOSIS — Z01411 Encounter for gynecological examination (general) (routine) with abnormal findings: Secondary | ICD-10-CM | POA: Diagnosis not present

## 2016-09-17 LAB — CYTOLOGY - PAP
Chlamydia: POSITIVE — AB
Diagnosis: UNDETERMINED — AB
HPV: DETECTED — AB
Neisseria Gonorrhea: NEGATIVE

## 2016-09-20 ENCOUNTER — Ambulatory Visit (HOSPITAL_COMMUNITY)
Admission: EM | Admit: 2016-09-20 | Discharge: 2016-09-20 | Disposition: A | Discharge status: Home / Self Care | Payer: Federal, State, Local not specified - PPO | Attending: Internal Medicine | Admitting: Internal Medicine

## 2016-09-20 ENCOUNTER — Encounter (HOSPITAL_COMMUNITY): Payer: Self-pay | Admitting: Emergency Medicine

## 2016-09-20 DIAGNOSIS — J019 Acute sinusitis, unspecified: Secondary | ICD-10-CM | POA: Diagnosis not present

## 2016-09-20 DIAGNOSIS — B9689 Other specified bacterial agents as the cause of diseases classified elsewhere: Secondary | ICD-10-CM | POA: Diagnosis not present

## 2016-09-20 MED ORDER — AZITHROMYCIN 250 MG PO TABS: 250.0000 mg | ORAL_TABLET | Freq: Every day | ORAL | 0 refills | Status: DC

## 2016-09-20 MED ORDER — PREDNISONE 50 MG PO TABS: 50.0000 mg | ORAL_TABLET | Freq: Every day | ORAL | 0 refills | Status: DC

## 2016-09-20 NOTE — ED Triage Notes (Signed)
[facility] a [facility] of sinus pain and pressure with the chills and body aches that started last night.

## 2016-09-20 NOTE — Discharge Instructions (Addendum)
Anticipate gradual improvement over the next several days.  Prescriptions for prednisone and zithromax sent to [facility] on [address].  Cough/congestion may take a couple weeks to subside.  Recheck for new fever >100.5, increasing phlegm production/nasal discharge, or if not starting to improve in a few days.

## 2016-09-20 NOTE — ED Provider Notes (Signed)
[facility]    CSN: [id number] Arrival date & time: 09/20/16  1509     History   Chief Complaint Chief Complaint  Patient presents with  . Facial Pain    HPI Yolanda Callahan is a 26 y.o. female. 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 She presents today with more than a week's history of nasal congestion, runny nose, some cough. She has a history of asthma. Last night, she started to feel worse again, with tactile temperature, achiness, increased congestion and runny nose. No nausea/vomiting, no diarrhea. No headache at present.    HPI  Past Medical History:  Diagnosis Date  . Anxiety   . Asthma   . Chlamydia contact, treated 07/2013   treated 2014, 2015  . CIN I (cervical intraepithelial neoplasia I) 2012  . Depression   . Polycystic ovarian disease   . Seasonal allergies     Patient Active Problem List   Diagnosis Date Noted  . H/O chlamydia infection 01/17/2014  . MDD (major depressive disorder) 10/27/2012  . CIN I (cervical intraepithelial neoplasia I)   . GAD (generalized anxiety disorder) 07/23/2011    Past Surgical History:  Procedure Laterality Date  . COLPOSCOPY  2012  . MOUTH SURGERY  2008  . WISDOM TOOTH EXTRACTION  06/2013    OB History    Gravida Para Term Preterm AB Living   0             SAB TAB Ectopic Multiple Live Births                   Home Medications    Prior to Admission medications   Medication Sig Start Date End Date Taking? Authorizing Provider  buPROPion (WELLBUTRIN XL) 300 MG 24 hr tablet Take 1 tablet (300 mg total) by mouth every morning. 04/23/14  Yes Nelly RoutArchana Kumar, MD  busPIRone (BUSPAR) 10 MG tablet Take 1 tablet (10 mg total) by mouth 2 (two) times daily. 04/23/14  Yes Nelly RoutArchana Kumar, MD  desogestrel-ethinyl estradiol (VIORELE) 0.15-0.02/0.01 MG (21/5) tablet Take 1 tablet by mouth daily. 08/26/15  Yes Romualdo BolkJill Evelyn Jertson, MD  azithromycin (ZITHROMAX) 250 MG tablet Take 1 tablet (250 mg total) by  mouth daily. Take first 2 tablets together, then 1 every day until finished. 09/20/16   Eustace MooreLaura W Maxwell Martorano, MD  predniSONE (DELTASONE) 50 MG tablet Take 1 tablet (50 mg total) by mouth daily. 09/20/16   Eustace MooreLaura W Zorah Backes, MD    Family History Family History  Problem Relation Age of Onset  . Anxiety disorder Father   . Hypertension Father   . Bipolar disorder Father   . Anxiety disorder Paternal Grandmother   . Depression Paternal Grandmother   . Diabetes Paternal Grandmother   . Hypertension Mother   . Endometriosis Mother   . Heart disease Maternal Grandmother   . Fibroids Maternal Grandmother   . Osteoporosis Maternal Grandmother   . Diabetes Paternal Grandfather   . Hypertension Maternal Grandfather     Social History Social History  Substance Use Topics  . Smoking status: Former Games developermoker  . Smokeless tobacco: Never Used  . Alcohol use 3.0 oz/week    3 Standard drinks or equivalent, 2 Cans of beer per week     Comment: Patient states she drinks 23 oz beer per week     Allergies   Penicillins   Review of Systems Review of Systems  All other systems reviewed and are negative.    Physical Exam Triage  Vital Signs ED Triage Vitals  Enc Vitals Group     BP 09/20/16 1635 130/79     Pulse Rate 09/20/16 1635 91     Resp 09/20/16 1635 20     Temp 09/20/16 1635 98.5 F (36.9 C)     Temp Source 09/20/16 1635 Oral     SpO2 09/20/16 1635 100 %     Weight --      Height --      Pain Score 09/20/16 1634 4     Pain Loc --    Updated Vital Signs BP 130/79 (BP Location: Right Arm)   Pulse 91   Temp 98.5 F (36.9 C) (Oral)   Resp 20   SpO2 100%   Physical Exam  Constitutional: She is oriented to person, place, and time.  Alert, nicely groomed Looks ill but not toxic Voice sounds very congested Nose is chapped and red  HENT:  Head: Atraumatic.  Bilateral TMs are dull, no erythema Marked nasal congestion on the left, moderate on the right Throat is slightly red, postnasal  drainage evident  Eyes:  Conjugate gaze, no eye redness/drainage  Neck: Neck supple.  Cardiovascular: Normal rate and regular rhythm.   Pulmonary/Chest: No respiratory distress. She has no wheezes.  Coarse but symmetric breath sounds throughout Diminished posteriorly  Abdominal: She exhibits no distension.  Musculoskeletal: Normal range of motion.  No leg swelling  Neurological: She is alert and oriented to person, place, and time.  Skin: Skin is warm and dry.  No cyanosis  Nursing note and vitals reviewed.    UC Treatments / Results   Procedures Procedures (including critical care time) None today  Final Clinical Impressions(s) / UC Diagnoses   Final diagnoses:  Acute bacterial sinusitis   Anticipate gradual improvement over the next several days.  Prescriptions for prednisone and zithromax sent to [facility] on [address].  Cough/congestion may take a couple weeks to subside.  Recheck for new fever >100.5, increasing phlegm production/nasal discharge, or if not starting to improve in a few days.  New Prescriptions New Prescriptions   AZITHROMYCIN (ZITHROMAX) 250 MG TABLET    Take 1 tablet (250 mg total) by mouth daily. Take first 2 tablets together, then 1 every day until finished.   PREDNISONE (DELTASONE) 50 MG TABLET    Take 1 tablet (50 mg total) by mouth daily.     Eustace Moore, MD 09/22/16 2100

## 2016-11-09 ENCOUNTER — Ambulatory Visit (HOSPITAL_COMMUNITY)
Admission: EM | Admit: 2016-11-09 | Discharge: 2016-11-09 | Disposition: A | Discharge status: Home / Self Care | Payer: Federal, State, Local not specified - PPO | Attending: Family Medicine | Admitting: Family Medicine

## 2016-11-09 ENCOUNTER — Ambulatory Visit (INDEPENDENT_AMBULATORY_CARE_PROVIDER_SITE_OTHER): Payer: Federal, State, Local not specified - PPO

## 2016-11-09 ENCOUNTER — Encounter (HOSPITAL_COMMUNITY): Payer: Self-pay | Admitting: Emergency Medicine

## 2016-11-09 DIAGNOSIS — J4541 Moderate persistent asthma with (acute) exacerbation: Secondary | ICD-10-CM

## 2016-11-09 DIAGNOSIS — R05 Cough: Secondary | ICD-10-CM

## 2016-11-09 DIAGNOSIS — R0602 Shortness of breath: Secondary | ICD-10-CM | POA: Diagnosis not present

## 2016-11-09 MED ORDER — IPRATROPIUM-ALBUTEROL 0.5-2.5 (3) MG/3ML IN SOLN
3.0000 mL | Freq: Once | RESPIRATORY_TRACT | Status: AC
  Administered 2016-11-09: 3 mL via RESPIRATORY_TRACT

## 2016-11-09 MED ORDER — DEXAMETHASONE SODIUM PHOSPHATE 10 MG/ML IJ SOLN
INTRAMUSCULAR | Status: AC
  Filled 2016-11-09: qty 1

## 2016-11-09 MED ORDER — PREDNISONE 10 MG (21) PO TBPK: ORAL_TABLET | ORAL | 0 refills | Status: DC

## 2016-11-09 MED ORDER — IPRATROPIUM-ALBUTEROL 0.5-2.5 (3) MG/3ML IN SOLN
RESPIRATORY_TRACT | Status: AC
  Filled 2016-11-09: qty 3

## 2016-11-09 MED ORDER — MONTELUKAST SODIUM 10 MG PO TABS: 10.0000 mg | ORAL_TABLET | Freq: Every day | ORAL | 2 refills | Status: DC

## 2016-11-09 MED ORDER — DEXAMETHASONE SODIUM PHOSPHATE 10 MG/ML IJ SOLN
10.0000 mg | Freq: Once | INTRAMUSCULAR | Status: AC
  Administered 2016-11-09: 10 mg via INTRAMUSCULAR

## 2016-11-09 MED ORDER — ALBUTEROL SULFATE HFA 108 (90 BASE) MCG/ACT IN AERS: 2.0000 | INHALATION_SPRAY | RESPIRATORY_TRACT | 3 refills | Status: DC | PRN

## 2016-11-09 NOTE — ED Triage Notes (Signed)
The patient presented to the [facility] with a complaint of asthma exacerbation that started today.

## 2016-11-09 NOTE — ED Provider Notes (Signed)
CSN: [id number]     Arrival date & time 11/09/16  1958 History   First MD Initiated Contact with Patient 11/09/16 2020     Chief Complaint  Patient presents with  . Asthma   (Consider location/radiation/quality/duration/timing/severity/associated sxs/prior Treatment) 26 year old female presents to clinic for an asthma exacerbation ongoing for 3 days, worsening today.   The history is provided by the patient.  Wheezing  Severity:  Moderate Severity compared to prior episodes:  Similar Onset quality:  Gradual Timing:  Constant Progression:  Worsening Chronicity:  Recurrent Context: exposure to allergen and pollens   Relieved by:  Beta-agonist inhaler and oral decongestants Worsened by:  Allergens Associated symptoms: chest tightness, cough, rhinorrhea and shortness of breath     Past Medical History:  Diagnosis Date  . Anxiety   . Asthma   . Chlamydia contact, treated 07/2013   treated 2014, 2015  . CIN I (cervical intraepithelial neoplasia I) 2012  . Depression   . Polycystic ovarian disease   . Seasonal allergies    Past Surgical History:  Procedure Laterality Date  . COLPOSCOPY  2012  . MOUTH SURGERY  2008  . WISDOM TOOTH EXTRACTION  06/2013   Family History  Problem Relation Age of Onset  . Anxiety disorder Father   . Hypertension Father   . Bipolar disorder Father   . Anxiety disorder Paternal Grandmother   . Depression Paternal Grandmother   . Diabetes Paternal Grandmother   . Hypertension Mother   . Endometriosis Mother   . Heart disease Maternal Grandmother   . Fibroids Maternal Grandmother   . Osteoporosis Maternal Grandmother   . Diabetes Paternal Grandfather   . Hypertension Maternal Grandfather    Social History  Substance Use Topics  . Smoking status: Former Games developer  . Smokeless tobacco: Never Used  . Alcohol use 3.0 oz/week    3 Standard drinks or equivalent, 2 Cans of beer per week     Comment: Patient states she drinks 23 oz beer per week    OB History    Gravida Para Term Preterm AB Living   0             SAB TAB Ectopic Multiple Live Births                 Review of Systems  Constitutional: Negative.   HENT: Positive for congestion, rhinorrhea and sneezing.   Eyes: Negative.   Respiratory: Positive for cough, chest tightness, shortness of breath and wheezing.   Cardiovascular: Negative.   Gastrointestinal: Negative.   Musculoskeletal: Negative.   Skin: Negative.   Neurological: Negative.     Allergies  Penicillins  Home Medications   Prior to Admission medications   Medication Sig Start Date End Date Taking? Authorizing Provider  buPROPion (WELLBUTRIN XL) 300 MG 24 hr tablet Take 1 tablet (300 mg total) by mouth every morning. 04/23/14  Yes Nelly Rout, MD  busPIRone (BUSPAR) 10 MG tablet Take 1 tablet (10 mg total) by mouth 2 (two) times daily. 04/23/14  Yes Nelly Rout, MD  desogestrel-ethinyl estradiol (VIORELE) 0.15-0.02/0.01 MG (21/5) tablet Take 1 tablet by mouth daily. 08/26/15  Yes Romualdo Bolk, MD  albuterol (PROVENTIL HFA;VENTOLIN HFA) 108 (90 Base) MCG/ACT inhaler Inhale 2 puffs into the lungs every 4 (four) hours as needed for wheezing or shortness of breath. 11/09/16   Dorena Bodo, NP  montelukast (SINGULAIR) 10 MG tablet Take 1 tablet (10 mg total) by mouth at bedtime. 11/09/16   Dorena Bodo, NP  predniSONE (STERAPRED UNI-PAK 21 TAB) 10 MG (21) TBPK tablet Take 6 tablets tomorrow, decrease by 1 each day till finished (6,5,4,3,2,1) 11/09/16   Dorena Bodo, NP   Meds Ordered and Administered this Visit   Medications  ipratropium-albuterol (DUONEB) 0.5-2.5 (3) MG/3ML nebulizer solution 3 mL (3 mLs Nebulization Given 11/09/16 2007)  dexamethasone (DECADRON) injection 10 mg (10 mg Intramuscular Given 11/09/16 2038)    BP (!) 154/101 (BP Location: Right Wrist)   Pulse (!) 118   Temp 99.7 F (37.6 C) (Oral)   Resp (!) 22   SpO2 98%  No data found.   Physical Exam   Constitutional: She appears well-developed and well-nourished. No distress.  HENT:  Head: Normocephalic and atraumatic.  Right Ear: External ear normal.  Left Ear: External ear normal.  Eyes: Conjunctivae are normal.  Neck: Normal range of motion.  Cardiovascular: Normal rate and regular rhythm.   Pulmonary/Chest: She is in respiratory distress. She has wheezes.  Neurological: She is alert.  Skin: Skin is warm and dry. Capillary refill takes less than 2 seconds. She is not diaphoretic. No pallor.  Psychiatric: She has a normal mood and affect. Her behavior is normal.  Nursing note and vitals reviewed.   Urgent Care Course     Procedures (including critical care time)  Labs Review Labs Reviewed - No data to display  Imaging Review Dg Chest 2 View  Result Date: 11/09/2016 CLINICAL DATA:  Cough, shortness of breath and nonproductive cough. Asthma exacerbation for 3 days. EXAM: CHEST  2 VIEW COMPARISON:  None. FINDINGS: Cardiomediastinal silhouette is normal. No pleural effusions or focal consolidations. Mild bronchitic changes. Trachea projects midline and there is no pneumothorax. Soft tissue planes and included osseous structures are non-suspicious. IMPRESSION: Mild bronchitic changes without focal consolidation. Electronically Signed   By: Awilda Metro M.D.   On: 11/09/2016 21:17           MDM   1. Moderate persistent asthma with exacerbation     Patient given a DuoNeb in clinic, injection of dexamethasone. Prescription sent to the pharmacy for steroid taper, Singulair, and refilled her albuterol inhaler. Recommend over-the-counter antihistamine for the remainder of the season. Follow-up with primary care, pulmonologist for symptoms as needed, or emergency room any time symptoms worsen.     Dorena Bodo, NP 11/09/16 2123

## 2016-11-09 NOTE — Discharge Instructions (Signed)
Treating your asthma exacerbation with a prednisone taper, take 6 tablets today then decreased by one each day until finished. Starting on Singulair, take one tablet every night at bedtime. I have also refilled her albuterol inhaler, you may do 1-2 puffs every 4-6 hours as needed for wheezing and shortness of breath. I also recommend taking Claritin every single day for the remainder of the allergy season, and follow-up with a pulmonologist for management of your condition.

## 2016-11-11 DIAGNOSIS — F411 Generalized anxiety disorder: Secondary | ICD-10-CM | POA: Diagnosis not present

## 2016-11-11 DIAGNOSIS — F331 Major depressive disorder, recurrent, moderate: Secondary | ICD-10-CM | POA: Diagnosis not present

## 2016-11-19 DIAGNOSIS — Z202 Contact with and (suspected) exposure to infections with a predominantly sexual mode of transmission: Secondary | ICD-10-CM | POA: Diagnosis not present

## 2016-11-19 DIAGNOSIS — N914 Secondary oligomenorrhea: Secondary | ICD-10-CM | POA: Diagnosis not present

## 2017-02-09 DIAGNOSIS — F331 Major depressive disorder, recurrent, moderate: Secondary | ICD-10-CM | POA: Diagnosis not present

## 2017-02-09 DIAGNOSIS — F411 Generalized anxiety disorder: Secondary | ICD-10-CM | POA: Diagnosis not present

## 2017-05-11 DIAGNOSIS — F331 Major depressive disorder, recurrent, moderate: Secondary | ICD-10-CM | POA: Diagnosis not present

## 2017-05-11 DIAGNOSIS — F411 Generalized anxiety disorder: Secondary | ICD-10-CM | POA: Diagnosis not present

## 2017-05-20 DIAGNOSIS — F331 Major depressive disorder, recurrent, moderate: Secondary | ICD-10-CM | POA: Diagnosis not present

## 2017-05-20 DIAGNOSIS — F411 Generalized anxiety disorder: Secondary | ICD-10-CM | POA: Diagnosis not present

## 2017-05-20 DIAGNOSIS — F4310 Post-traumatic stress disorder, unspecified: Secondary | ICD-10-CM | POA: Diagnosis not present

## 2017-06-18 DIAGNOSIS — Z79899 Other long term (current) drug therapy: Secondary | ICD-10-CM | POA: Diagnosis not present

## 2017-06-18 DIAGNOSIS — J4530 Mild persistent asthma, uncomplicated: Secondary | ICD-10-CM | POA: Diagnosis not present

## 2017-06-18 DIAGNOSIS — F331 Major depressive disorder, recurrent, moderate: Secondary | ICD-10-CM | POA: Diagnosis not present

## 2017-06-18 DIAGNOSIS — Z1389 Encounter for screening for other disorder: Secondary | ICD-10-CM | POA: Diagnosis not present

## 2017-06-18 DIAGNOSIS — F430 Acute stress reaction: Secondary | ICD-10-CM | POA: Diagnosis not present

## 2017-06-24 DIAGNOSIS — R05 Cough: Secondary | ICD-10-CM | POA: Diagnosis not present

## 2017-06-24 DIAGNOSIS — J45909 Unspecified asthma, uncomplicated: Secondary | ICD-10-CM | POA: Diagnosis not present

## 2017-08-19 DIAGNOSIS — F411 Generalized anxiety disorder: Secondary | ICD-10-CM | POA: Diagnosis not present

## 2017-08-19 DIAGNOSIS — F331 Major depressive disorder, recurrent, moderate: Secondary | ICD-10-CM | POA: Diagnosis not present

## 2017-09-08 ENCOUNTER — Encounter (HOSPITAL_COMMUNITY): Payer: Self-pay | Admitting: Emergency Medicine

## 2017-09-08 ENCOUNTER — Emergency Department (HOSPITAL_COMMUNITY): Payer: Federal, State, Local not specified - PPO

## 2017-09-08 ENCOUNTER — Other Ambulatory Visit: Payer: Self-pay

## 2017-09-08 ENCOUNTER — Emergency Department (HOSPITAL_COMMUNITY)
Admission: EM | Admit: 2017-09-08 | Discharge: 2017-09-08 | Disposition: A | Discharge status: Home / Self Care | Payer: Federal, State, Local not specified - PPO | Attending: Emergency Medicine | Admitting: Emergency Medicine

## 2017-09-08 DIAGNOSIS — Z87891 Personal history of nicotine dependence: Secondary | ICD-10-CM | POA: Diagnosis not present

## 2017-09-08 DIAGNOSIS — R079 Chest pain, unspecified: Secondary | ICD-10-CM | POA: Diagnosis not present

## 2017-09-08 DIAGNOSIS — R42 Dizziness and giddiness: Secondary | ICD-10-CM | POA: Diagnosis not present

## 2017-09-08 DIAGNOSIS — J45909 Unspecified asthma, uncomplicated: Secondary | ICD-10-CM | POA: Diagnosis not present

## 2017-09-08 DIAGNOSIS — R072 Precordial pain: Secondary | ICD-10-CM

## 2017-09-08 DIAGNOSIS — Z79899 Other long term (current) drug therapy: Secondary | ICD-10-CM | POA: Diagnosis not present

## 2017-09-08 LAB — CBC
HCT: 38.2 % (ref 36.0–46.0)
Hemoglobin: 11.7 g/dL — ABNORMAL LOW (ref 12.0–15.0)
MCH: 25.5 pg — ABNORMAL LOW (ref 26.0–34.0)
MCHC: 30.6 g/dL (ref 30.0–36.0)
MCV: 83.2 fL (ref 78.0–100.0)
Platelets: 310 10*3/uL (ref 150–400)
RBC: 4.59 MIL/uL (ref 3.87–5.11)
RDW: 15.3 % (ref 11.5–15.5)
WBC: 6.8 10*3/uL (ref 4.0–10.5)

## 2017-09-08 LAB — I-STAT TROPONIN, ED: Troponin i, poc: 0 ng/mL (ref 0.00–0.08)

## 2017-09-08 LAB — BASIC METABOLIC PANEL
Anion gap: 10 (ref 5–15)
BUN: 12 mg/dL (ref 6–20)
CO2: 20 mmol/L — ABNORMAL LOW (ref 22–32)
Calcium: 9.2 mg/dL (ref 8.9–10.3)
Chloride: 109 mmol/L (ref 101–111)
Creatinine, Ser: 0.75 mg/dL (ref 0.44–1.00)
GFR calc Af Amer: >60 mL/min (ref >60)
GFR calc non Af Amer: >60 mL/min (ref >60)
Glucose, Bld: 87 mg/dL (ref 65–99)
Potassium: 4.2 mmol/L (ref 3.5–5.1)
Sodium: 139 mmol/L (ref 135–145)

## 2017-09-08 LAB — I-STAT BETA HCG BLOOD, ED (MC, WL, AP ONLY): I-stat hCG, quantitative: <5 m[IU]/mL (ref <5)

## 2017-09-08 MED ORDER — GI COCKTAIL ~~LOC~~
30.0000 mL | Freq: Once | ORAL | Status: AC
  Administered 2017-09-08: 30 mL via ORAL
  Filled 2017-09-08: qty 30

## 2017-09-08 MED ORDER — NAPROXEN 500 MG PO TABS: 500.0000 mg | ORAL_TABLET | Freq: Two times a day (BID) | ORAL | 0 refills | Status: DC

## 2017-09-08 MED ORDER — FAMOTIDINE 20 MG PO TABS: 20.0000 mg | ORAL_TABLET | Freq: Two times a day (BID) | ORAL | 0 refills | Status: DC

## 2017-09-08 NOTE — ED Provider Notes (Signed)
[facility] EMERGENCY DEPARTMENT Provider Note   CSN: [id number] Arrival date & time: 09/08/17  1605     History   Chief Complaint Chief Complaint  Patient presents with  . Chest Pain    HPI Yolanda Callahan is a 27 y.o. female.  Patient presents to the emergency department with complaint of chest pain.  Patient was sitting still at approximately 2 PM today when her bilateral arm started feeling heavy.  Patient developed 7 out of 10 midsternal chest pain did not radiate.  Her symptoms were not associated with shortness of breath, vomiting, or diaphoresis.  She states that she stood up and felt lightheaded.  No treatments prior to arrival.  Pain improved slightly to 5 out of 10 but did not go away prompting emergency department visit.  Pain does not get worse with deep breathing. She denies any recent fevers or cough.  No nausea, diarrhea, or urinary symptoms. Patient does take OCP. Patient denies other risk factors for pulmonary embolism including: unilateral leg swelling, history of DVT/PE/other blood clots, recent immobilizations, recent surgery, recent travel (>4hr segment), malignancy, hemoptysis. Denies h/o HTN, high cholesterol, DM, smoking. She does have family members with heart disease, but not who were young. The onset of this condition was acute. The course is constant. Aggravating factors: none. Alleviating factors: none. She does report being under a lot of stress. She has not eaten since 11 AM, but states no recent pain due to eating.       Past Medical History:  Diagnosis Date  . Anxiety   . Asthma   . Chlamydia contact, treated 07/2013   treated 2014, 2015  . CIN I (cervical intraepithelial neoplasia I) 2012  . Depression   . Polycystic ovarian disease   . Seasonal allergies     Patient Active Problem List   Diagnosis Date Noted  . H/O chlamydia infection 01/17/2014  . MDD (major depressive disorder) 10/27/2012  . CIN I (cervical  intraepithelial neoplasia I)   . GAD (generalized anxiety disorder) 07/23/2011    Past Surgical History:  Procedure Laterality Date  . COLPOSCOPY  2012  . MOUTH SURGERY  2008  . WISDOM TOOTH EXTRACTION  06/2013    OB History    Gravida Para Term Preterm AB Living   0             SAB TAB Ectopic Multiple Live Births                   Home Medications    Prior to Admission medications   Medication Sig Start Date End Date Taking? Authorizing Provider  albuterol (PROVENTIL HFA;VENTOLIN HFA) 108 (90 Base) MCG/ACT inhaler Inhale 2 puffs into the lungs every 4 (four) hours as needed for wheezing or shortness of breath. 11/09/16   Dorena Bodo, NP  buPROPion (WELLBUTRIN XL) 300 MG 24 hr tablet Take 1 tablet (300 mg total) by mouth every morning. 04/23/14   Nelly Rout, MD  busPIRone (BUSPAR) 10 MG tablet Take 1 tablet (10 mg total) by mouth 2 (two) times daily. 04/23/14   Nelly Rout, MD  desogestrel-ethinyl estradiol (VIORELE) 0.15-0.02/0.01 MG (21/5) tablet Take 1 tablet by mouth daily. 08/26/15   Romualdo Bolk, MD  montelukast (SINGULAIR) 10 MG tablet Take 1 tablet (10 mg total) by mouth at bedtime. 11/09/16   Dorena Bodo, NP  predniSONE (STERAPRED UNI-PAK 21 TAB) 10 MG (21) TBPK tablet Take 6 tablets tomorrow, decrease by 1 each day till finished (  6,3,8,7,5,66,5,4,3,2,1) 11/09/16   Dorena BodoKennard, Lawrence, NP    Family History Family History  Problem Relation Age of Onset  . Anxiety disorder Father   . Hypertension Father   . Bipolar disorder Father   . Anxiety disorder Paternal Grandmother   . Depression Paternal Grandmother   . Diabetes Paternal Grandmother   . Hypertension Mother   . Endometriosis Mother   . Heart disease Maternal Grandmother   . Fibroids Maternal Grandmother   . Osteoporosis Maternal Grandmother   . Diabetes Paternal Grandfather   . Hypertension Maternal Grandfather     Social History Social History   Tobacco Use  . Smoking status: Former Games developermoker    . Smokeless tobacco: Never Used  Substance Use Topics  . Alcohol use: Yes    Alcohol/week: 3.0 oz    Types: 2 Cans of beer, 3 Standard drinks or equivalent per week    Comment: Patient states she drinks 23 oz beer per week  . Drug use: No     Allergies   Penicillins   Review of Systems Review of Systems  Constitutional: Negative for diaphoresis and fever.  Eyes: Negative for redness.  Respiratory: Negative for cough and shortness of breath.   Cardiovascular: Positive for chest pain. Negative for palpitations and leg swelling.  Gastrointestinal: Negative for abdominal pain, nausea and vomiting.  Genitourinary: Negative for dysuria.  Musculoskeletal: Negative for back pain and neck pain.  Skin: Negative for rash.  Neurological: Positive for light-headedness. Negative for dizziness, syncope and numbness.  Psychiatric/Behavioral: The patient is not nervous/anxious.      Physical Exam Updated Vital Signs BP (!) 147/95 (BP Location: Right Arm)   Pulse 92   Temp 98.3 F (36.8 C) (Oral)   Resp 18   Ht 4\' 11"  (1.499 m)   Wt 108.9 kg (240 lb)   LMP 06/08/2017 (Approximate)   SpO2 98%   BMI 48.47 kg/m   Physical Exam  Constitutional: She appears well-developed and well-nourished.  HENT:  Head: Normocephalic and atraumatic.  Mouth/Throat: Mucous membranes are normal. Mucous membranes are not dry.  Eyes: Conjunctivae are normal.  Neck: Trachea normal and normal range of motion. Neck supple. Normal carotid pulses and no JVD present. No muscular tenderness present. Carotid bruit is not present. No tracheal deviation present.  Cardiovascular: Normal rate, regular rhythm, S1 normal, S2 normal, normal heart sounds and intact distal pulses. Exam reveals no decreased pulses.  No murmur heard. Pulmonary/Chest: Effort normal. No respiratory distress. She has no wheezes. She has no rhonchi. She has no rales. She exhibits no tenderness.  Abdominal: Soft. Normal aorta and bowel sounds  are normal. There is no tenderness. There is no rebound and no guarding.  Musculoskeletal: Normal range of motion.       Right lower leg: She exhibits no tenderness and no edema.       Left lower leg: She exhibits no tenderness and no edema.  Neurological: She is alert.  Skin: Skin is warm and dry. She is not diaphoretic. No cyanosis. No pallor.  Psychiatric: She has a normal mood and affect.  Nursing note and vitals reviewed.    ED Treatments / Results  Labs (all labs ordered are listed, but only abnormal results are displayed) Labs Reviewed  BASIC METABOLIC PANEL - Abnormal; Notable for the following components:      Result Value   CO2 20 (*)    All other components within normal limits  CBC - Abnormal; Notable for the following components:  Hemoglobin 11.7 (*)    MCH 25.5 (*)    All other components within normal limits  I-STAT TROPONIN, ED  I-STAT BETA HCG BLOOD, ED ([facility], [facility], [facility] ONLY)    EKG  EKG Interpretation None       Radiology Dg Chest 2 View  Result Date: 09/08/2017 CLINICAL DATA:  Central chest pain and dizziness x1 day EXAM: CHEST  2 VIEW COMPARISON:  11/09/2016 FINDINGS: The heart size and mediastinal contours are within normal limits. Both lungs are clear. The visualized skeletal structures are unremarkable. IMPRESSION: No active cardiopulmonary disease. Electronically Signed   By: Tollie Eth M.D.   On: 09/08/2017 17:02    Procedures Procedures (including critical care time)  Medications Ordered in ED Medications - No data to display   Initial Impression / Assessment and Plan / ED Course  I have reviewed the triage vital signs and the nursing notes.  Pertinent labs & imaging results that were available during my care of the patient were reviewed by me and considered in my medical decision making (see chart for details).     Patient seen and examined. Work-up initiated. Medications ordered.   Vital signs reviewed and are as follows: BP (!) 147/95  (BP Location: Right Arm)   Pulse 92   Temp 98.3 F (36.8 C) (Oral)   Resp 18   Ht 4\' 11"  (1.499 m)   Wt 108.9 kg (240 lb)   LMP 06/08/2017 (Approximate)   SpO2 98%   BMI 48.47 kg/m   ED ECG REPORT   Date: 09/08/2017  Rate: 72  Rhythm: normal sinus rhythm  QRS Axis: normal  Intervals: normal  ST/T Wave abnormalities: normal  Conduction Disutrbances:none  Narrative Interpretation:   Old EKG Reviewed: none available  I have personally reviewed the EKG tracing and agree with the computerized printout as noted.  6:30 PM pain improved to a 3 out of 10 with GI cocktail.  Patient continues to have no shortness of breath.  She is comfortable with discharged home at this time.  Will place on Pepcid for several days.  Discussed that patient should follow-up with her primary care doctor regarding the symptoms and return immediately to the emergency department if she has worsening chest pain, shortness of breath, new symptoms or other concerns.  She verbalizes understanding and agrees with plan.    Final Clinical Impressions(s) / ED Diagnoses   Final diagnoses:  Precordial pain   Patient with chest pain today.  Symptoms are atypical for ACS.  Pain started at rest and there is no associated vomiting or diaphoresis.  Troponin negative x1.  EKG is normal.  Symptoms gradually improving.  Considered PE, however patient has no shortness of breath or pleuritic pain.  She is on oral contraceptives but is not tachycardic or hypoxic.  I have very low suspicion for PE given her clinical history and exam.  Possible etiologies include GI, anxiety, musculoskeletal pain.  Patient did have some improvement with GI cocktail here.  Feel safe for discharged home at this time with immediate return if symptoms worsen or change.  Patient seems reliable to return with worsening symptoms.  Discussion as above.   ED Discharge Orders        Ordered    famotidine (PEPCID) 20 MG tablet  2 times daily     09/08/17  1831    naproxen (NAPROSYN) 500 MG tablet  2 times daily     09/08/17 1831       Renne Crigler,  PA-C 09/08/17 1833    Raeford Razor, MD 09/08/17 2316

## 2017-09-08 NOTE — ED Triage Notes (Signed)
Pt presents to ED for assessment of central chest pain, no radiation, beginning while patient was sitting down "doing nothing", sudden onset at 2pm, with a heavy feeling in her bilateral arms just prior to the pain.  No cough recently.  Pt c/o light-headedness, and a very brief episode of nausea.

## 2017-09-08 NOTE — Discharge Instructions (Signed)
Please read and follow all provided instructions.  Your diagnoses today include:  1. Precordial pain     Tests performed today include:  An EKG of your heart  A chest x-ray  Cardiac enzymes - a blood test for heart muscle damage  Blood counts and electrolytes  Vital signs. See below for your results today.   Medications prescribed:   Pepcid (famotidine) - antihistamine  You can find this medication over-the-counter.   DO NOT exceed:   20mg  Pepcid every 12 hours   Naproxen - anti-inflammatory pain medication  Do not exceed 500mg  naproxen every 12 hours, take with food  You have been prescribed an anti-inflammatory medication or NSAID. Take with food. Take smallest effective dose for the shortest duration needed for your pain. Stop taking if you experience stomach pain or vomiting.   Take any prescribed medications only as directed.  Follow-up instructions: Please follow-up with your primary care provider as soon as you can for further evaluation of your symptoms.   Return instructions:  SEEK IMMEDIATE MEDICAL ATTENTION IF:  You have severe chest pain, especially if the pain is crushing or pressure-like and spreads to the arms, back, neck, or jaw, or if you have sweating, nausea (feeling sick to your stomach), or shortness of breath. THIS IS AN EMERGENCY. Don't wait to see if the pain will go away. Get medical help at once. Call 911 or 0 (operator). DO NOT drive yourself to the hospital.   Your chest pain gets worse and does not go away with rest.   You have an attack of chest pain lasting longer than usual, despite rest and treatment with the medications your caregiver has prescribed.   You wake from sleep with chest pain or shortness of breath.  You feel dizzy or faint.  You have chest pain not typical of your usual pain for which you originally saw your caregiver.   You have any other emergent concerns regarding your health.  Additional Information: Chest  pain comes from many different causes. Your caregiver has diagnosed you as having chest pain that is not specific for one problem, but does not require admission.  You are at low risk for an acute heart condition or other serious illness.   Your vital signs today were: BP (!) 147/95 (BP Location: Right Arm)    Pulse 92    Temp 98.3 F (36.8 C) (Oral)    Resp 18    Ht 4\' 11"  (1.499 m)    Wt 108.9 kg (240 lb)    LMP 06/08/2017 (Approximate)    SpO2 98%    BMI 48.47 kg/m  If your blood pressure (BP) was elevated above 135/85 this visit, please have this repeated by your doctor within one month. --------------

## 2017-09-20 ENCOUNTER — Other Ambulatory Visit (HOSPITAL_COMMUNITY)
Admission: RE | Admit: 2017-09-20 | Discharge: 2017-09-20 | Disposition: A | Discharge status: Home / Self Care | Payer: Federal, State, Local not specified - PPO | Source: Ambulatory Visit | Attending: Obstetrics and Gynecology | Admitting: Obstetrics and Gynecology

## 2017-09-20 ENCOUNTER — Other Ambulatory Visit: Payer: Self-pay | Admitting: Obstetrics and Gynecology

## 2017-09-20 DIAGNOSIS — Z01419 Encounter for gynecological examination (general) (routine) without abnormal findings: Secondary | ICD-10-CM | POA: Diagnosis not present

## 2017-09-20 DIAGNOSIS — Z124 Encounter for screening for malignant neoplasm of cervix: Secondary | ICD-10-CM | POA: Insufficient documentation

## 2017-09-20 DIAGNOSIS — Z3202 Encounter for pregnancy test, result negative: Secondary | ICD-10-CM | POA: Diagnosis not present

## 2017-09-24 LAB — CYTOLOGY - PAP
Chlamydia: NEGATIVE
Diagnosis: NEGATIVE
Neisseria Gonorrhea: NEGATIVE

## 2017-12-21 DIAGNOSIS — F411 Generalized anxiety disorder: Secondary | ICD-10-CM | POA: Diagnosis not present

## 2017-12-21 DIAGNOSIS — F331 Major depressive disorder, recurrent, moderate: Secondary | ICD-10-CM | POA: Diagnosis not present

## 2018-03-29 DIAGNOSIS — F331 Major depressive disorder, recurrent, moderate: Secondary | ICD-10-CM | POA: Diagnosis not present

## 2018-03-29 DIAGNOSIS — F411 Generalized anxiety disorder: Secondary | ICD-10-CM | POA: Diagnosis not present

## 2018-07-04 ENCOUNTER — Encounter (HOSPITAL_COMMUNITY): Payer: Self-pay | Admitting: Emergency Medicine

## 2018-07-04 ENCOUNTER — Ambulatory Visit (HOSPITAL_COMMUNITY)
Admission: EM | Admit: 2018-07-04 | Discharge: 2018-07-04 | Disposition: A | Discharge status: Home / Self Care | Payer: Federal, State, Local not specified - PPO | Attending: Internal Medicine | Admitting: Internal Medicine

## 2018-07-04 DIAGNOSIS — J189 Pneumonia, unspecified organism: Secondary | ICD-10-CM | POA: Insufficient documentation

## 2018-07-04 MED ORDER — PREDNISONE 10 MG PO TABS: 20.0000 mg | ORAL_TABLET | Freq: Every day | ORAL | 0 refills | Status: DC

## 2018-07-04 MED ORDER — DOXYCYCLINE HYCLATE 100 MG PO CAPS: 100.0000 mg | ORAL_CAPSULE | Freq: Two times a day (BID) | ORAL | 0 refills | Status: DC

## 2018-07-04 MED ORDER — ALBUTEROL SULFATE HFA 108 (90 BASE) MCG/ACT IN AERS: 2.0000 | INHALATION_SPRAY | RESPIRATORY_TRACT | 3 refills | Status: AC | PRN

## 2018-07-04 NOTE — ED Triage Notes (Signed)
Pt c/o cough x 2 weeks  

## 2018-07-04 NOTE — ED Provider Notes (Signed)
[facility]    CSN: [id number] Arrival date & time: 07/04/18  1647     History   Chief Complaint Chief Complaint  Patient presents with  . Cough    HPI Yolanda Callahan is a 27 y.o. female.   27 year old female with asthma presents to urgent care complaining of severe cough.  The cough is productive of thick yellow sputum.  She has been sick intermittently for the last 3 weeks.  Started with sinus pressure and congestion followed by nasal drainage that was purulent.  The patient has had chills but denies documented fevers.  She denies nausea, vomiting or diarrhea.  She has been using her albuterol inhaler more.     Past Medical History:  Diagnosis Date  . Anxiety   . Asthma   . Chlamydia contact, treated 07/2013   treated 2014, 2015  . CIN I (cervical intraepithelial neoplasia I) 2012  . Depression   . Polycystic ovarian disease   . Seasonal allergies     Patient Active Problem List   Diagnosis Date Noted  . H/O chlamydia infection 01/17/2014  . MDD (major depressive disorder) 10/27/2012  . CIN I (cervical intraepithelial neoplasia I)   . GAD (generalized anxiety disorder) 07/23/2011    Past Surgical History:  Procedure Laterality Date  . COLPOSCOPY  2012  . MOUTH SURGERY  2008  . WISDOM TOOTH EXTRACTION  06/2013    OB History    Gravida  0   Para      Term      Preterm      AB      Living        SAB      TAB      Ectopic      Multiple      Live Births               Home Medications    Prior to Admission medications   Medication Sig Start Date End Date Taking? Authorizing Provider  clonazePAM (KLONOPIN) 1 MG tablet Take 1 mg by mouth 2 (two) times daily.   Yes [provider]  albuterol (PROVENTIL HFA;VENTOLIN HFA) 108 (90 Base) MCG/ACT inhaler Inhale 2 puffs into the lungs every 4 (four) hours as needed for wheezing or shortness of breath. 11/09/16   Dorena Bodo, NP  buPROPion (WELLBUTRIN XL) 300 MG 24 hr  tablet Take 1 tablet (300 mg total) by mouth every morning. 04/23/14   Nelly Rout, MD  busPIRone (BUSPAR) 10 MG tablet Take 1 tablet (10 mg total) by mouth 2 (two) times daily. 04/23/14   Nelly Rout, MD  desogestrel-ethinyl estradiol (VIORELE) 0.15-0.02/0.01 MG (21/5) tablet Take 1 tablet by mouth daily. 08/26/15   Romualdo Bolk, MD  famotidine (PEPCID) 20 MG tablet Take 1 tablet (20 mg total) by mouth 2 (two) times daily. 09/08/17   Renne Crigler, PA-C  montelukast (SINGULAIR) 10 MG tablet Take 1 tablet (10 mg total) by mouth at bedtime. Patient not taking: Reported on 07/04/2018 11/09/16   Dorena Bodo, NP  naproxen (NAPROSYN) 500 MG tablet Take 1 tablet (500 mg total) by mouth 2 (two) times daily. Patient not taking: Reported on 07/04/2018 09/08/17   Renne Crigler, PA-C  predniSONE (STERAPRED UNI-PAK 21 TAB) 10 MG (21) TBPK tablet Take 6 tablets tomorrow, decrease by 1 each day till finished (6,5,4,3,2,1) Patient not taking: Reported on 07/04/2018 11/09/16   Dorena Bodo, NP    Family History Family History  Problem Relation Age  of Onset  . Anxiety disorder Father   . Hypertension Father   . Bipolar disorder Father   . Anxiety disorder Paternal Grandmother   . Depression Paternal Grandmother   . Diabetes Paternal Grandmother   . Hypertension Mother   . Endometriosis Mother   . Heart disease Maternal Grandmother   . Fibroids Maternal Grandmother   . Osteoporosis Maternal Grandmother   . Diabetes Paternal Grandfather   . Hypertension Maternal Grandfather     Social History Social History   Tobacco Use  . Smoking status: Former Games developer  . Smokeless tobacco: Never Used  Substance Use Topics  . Alcohol use: Yes    Alcohol/week: 5.0 standard drinks    Types: 2 Cans of beer, 3 Standard drinks or equivalent per week    Comment: Patient states she drinks 23 oz beer per week  . Drug use: No     Allergies   Penicillins   Review of Systems Review of Systems    Constitutional: Negative for chills and fever.  HENT: Negative for sore throat and tinnitus.   Eyes: Negative for redness.  Respiratory: Positive for cough. Negative for shortness of breath.   Cardiovascular: Negative for chest pain and palpitations.  Gastrointestinal: Negative for abdominal pain, diarrhea, nausea and vomiting.  Genitourinary: Negative for dysuria, frequency and urgency.  Musculoskeletal: Negative for myalgias.  Skin: Negative for rash.       No lesions  Neurological: Negative for weakness.  Hematological: Does not bruise/bleed easily.  Psychiatric/Behavioral: Negative for suicidal ideas.     Physical Exam Triage Vital Signs ED Triage Vitals  Enc Vitals Group     BP 07/04/18 1751 (!) 141/116     Pulse Rate 07/04/18 1751 75     Resp 07/04/18 1751 16     Temp 07/04/18 1751 98.9 F (37.2 C)     Temp src --      SpO2 07/04/18 1751 99 %     Weight --      Height --      Head Circumference --      Peak Flow --      Pain Score 07/04/18 1752 0     Pain Loc --      Pain Edu? --      Excl. in GC? --    No data found.  Updated Vital Signs BP (!) 141/116   Pulse 75   Temp 98.9 F (37.2 C)   Resp 16   SpO2 99%   Visual Acuity Right Eye Distance:   Left Eye Distance:   Bilateral Distance:    Right Eye Near:   Left Eye Near:    Bilateral Near:     Physical Exam Vitals signs and nursing note reviewed.  Constitutional:      General: She is not in acute distress.    Appearance: She is well-developed.  HENT:     Head: Normocephalic and atraumatic.  Eyes:     General: No scleral icterus.    Conjunctiva/sclera: Conjunctivae normal.     Pupils: Pupils are equal, round, and reactive to light.  Neck:     Musculoskeletal: Normal range of motion and neck supple.     Thyroid: No thyromegaly.     Vascular: No JVD.     Trachea: No tracheal deviation.  Cardiovascular:     Rate and Rhythm: Normal rate and regular rhythm.     Heart sounds: Normal heart  sounds. No murmur. No friction rub. No gallop.  Pulmonary:     Effort: Pulmonary effort is normal.     Breath sounds: Normal breath sounds.  Abdominal:     General: Bowel sounds are normal. There is no distension.     Palpations: Abdomen is soft.     Tenderness: There is no abdominal tenderness.  Musculoskeletal: Normal range of motion.  Lymphadenopathy:     Cervical: No cervical adenopathy.  Skin:    General: Skin is warm and dry.  Neurological:     Mental Status: She is alert and oriented to person, place, and time.     Cranial Nerves: No cranial nerve deficit.  Psychiatric:        Behavior: Behavior normal.        Thought Content: Thought content normal.        Judgment: Judgment normal.      UC Treatments / Results  Labs (all labs ordered are listed, but only abnormal results are displayed) Labs Reviewed - No data to display  EKG None  Radiology No results found.  Procedures Procedures (including critical care time)  Medications Ordered in UC Medications - No data to display  Initial Impression / Assessment and Plan / UC Course  I have reviewed the triage vital signs and the nursing notes.  Pertinent labs & imaging results that were available during my care of the patient were reviewed by me and considered in my medical decision making (see chart for details).     Cough concerning for pneumonia.  Differential diagnosis includes purulent sinus drainage.  Either way the patient requires antibiotics.  Final Clinical Impressions(s) / UC Diagnoses   Final diagnoses:  Community acquired pneumonia, unspecified laterality   Discharge Instructions   None    ED Prescriptions    None     Controlled Substance Prescriptions [place] Controlled Substance Registry consulted? Not Applicable   Arnaldo Nataliamond, Vona Whiters S, MD 07/04/18 [phone]

## 2018-07-06 ENCOUNTER — Other Ambulatory Visit: Payer: Self-pay

## 2018-07-06 ENCOUNTER — Ambulatory Visit (INDEPENDENT_AMBULATORY_CARE_PROVIDER_SITE_OTHER): Payer: Federal, State, Local not specified - PPO

## 2018-07-06 ENCOUNTER — Encounter (HOSPITAL_COMMUNITY): Payer: Self-pay | Admitting: Emergency Medicine

## 2018-07-06 ENCOUNTER — Ambulatory Visit (HOSPITAL_COMMUNITY)
Admission: EM | Admit: 2018-07-06 | Discharge: 2018-07-06 | Disposition: A | Discharge status: Home / Self Care | Payer: Federal, State, Local not specified - PPO | Attending: Family Medicine | Admitting: Family Medicine

## 2018-07-06 DIAGNOSIS — R05 Cough: Secondary | ICD-10-CM | POA: Diagnosis not present

## 2018-07-06 DIAGNOSIS — J22 Unspecified acute lower respiratory infection: Secondary | ICD-10-CM | POA: Insufficient documentation

## 2018-07-06 MED ORDER — CETIRIZINE HCL 10 MG PO CAPS: 10.0000 mg | ORAL_CAPSULE | Freq: Every day | ORAL | 0 refills | Status: DC

## 2018-07-06 MED ORDER — HYDROCODONE-HOMATROPINE 5-1.5 MG/5ML PO SYRP: 5.0000 mL | ORAL_SOLUTION | Freq: Four times a day (QID) | ORAL | 0 refills | Status: DC | PRN

## 2018-07-06 MED ORDER — ONDANSETRON 4 MG PO TBDP: 4.0000 mg | ORAL_TABLET | Freq: Three times a day (TID) | ORAL | 0 refills | Status: DC | PRN

## 2018-07-06 MED ORDER — PREDNISONE 50 MG PO TABS: 50.0000 mg | ORAL_TABLET | Freq: Every day | ORAL | 0 refills | Status: AC

## 2018-07-06 NOTE — Discharge Instructions (Addendum)
I want to increase your prednisone from 20 mg daily to 50 mg daily I have sent in hycodan cough syrup to use as needed-this will cause drowsiness, please limit use to at home or bedtime, do not drive or work after taking You may also try starting daily allergy pill like Zyrtec or Claritin to help with any congestion and drainage contributing to cough-I have sent in the generic of Zyrtec, you may get this over-the-counter if it is cheaper

## 2018-07-06 NOTE — ED Triage Notes (Signed)
PT was seen Monday and diagnosed with pneumonia. PT is taking her meds, but has not improved.

## 2018-07-06 NOTE — ED Provider Notes (Signed)
[facility]    CSN: [id number] Arrival date & time: 07/06/18  1931     History   Chief Complaint Chief Complaint  Patient presents with  . Cough    HPI Yolanda Callahan is a 27 y.o. female history of asthma presenting today for evaluation of cough.  Patient was seen here approximately 2 days ago and treated for clinical pneumonia.  She was started on doxycycline and 20 mg of prednisone daily.  She states that she has not seen any improvement over the past 2 days.  She is concerned as her coughing is often leading to posttussive emesis and she is concerned about the antibiotic remaining in her system.  Cough keeping her up at night.  Has also had associated congestion, rhinorrhea and sore throat but relates this to the cough.  Denies any known fevers.  In total her cough is been going on for approximately 3 weeks.  Denies any recent travel, immobilization.  Denies previous DVT/PE.  Denies any leg pain or leg swelling.  HPI  Past Medical History:  Diagnosis Date  . Anxiety   . Asthma   . Chlamydia contact, treated 07/2013   treated 2014, 2015  . CIN I (cervical intraepithelial neoplasia I) 2012  . Depression   . Polycystic ovarian disease   . Seasonal allergies     Patient Active Problem List   Diagnosis Date Noted  . H/O chlamydia infection 01/17/2014  . MDD (major depressive disorder) 10/27/2012  . CIN I (cervical intraepithelial neoplasia I)   . GAD (generalized anxiety disorder) 07/23/2011    Past Surgical History:  Procedure Laterality Date  . COLPOSCOPY  2012  . MOUTH SURGERY  2008  . WISDOM TOOTH EXTRACTION  06/2013    OB History    Gravida  0   Para      Term      Preterm      AB      Living        SAB      TAB      Ectopic      Multiple      Live Births               Home Medications    Prior to Admission medications   Medication Sig Start Date End Date Taking? Authorizing Provider  albuterol (PROVENTIL HFA;VENTOLIN  HFA) 108 (90 Base) MCG/ACT inhaler Inhale 2 puffs into the lungs every 4 (four) hours as needed for wheezing or shortness of breath. 07/04/18  Yes Arnaldo Nataliamond, Michael S, MD  buPROPion (WELLBUTRIN XL) 300 MG 24 hr tablet Take 1 tablet (300 mg total) by mouth every morning. 04/23/14  Yes Nelly RoutKumar, Archana, MD  busPIRone (BUSPAR) 10 MG tablet Take 1 tablet (10 mg total) by mouth 2 (two) times daily. 04/23/14  Yes Nelly RoutKumar, Archana, MD  clonazePAM (KLONOPIN) 1 MG tablet Take 1 mg by mouth 2 (two) times daily.   Yes [provider]  desogestrel-ethinyl estradiol (VIORELE) 0.15-0.02/0.01 MG (21/5) tablet Take 1 tablet by mouth daily. 08/26/15  Yes Romualdo BolkJertson, Jill Evelyn, MD  doxycycline (VIBRAMYCIN) 100 MG capsule Take 1 capsule (100 mg total) by mouth 2 (two) times daily. 07/04/18  Yes Arnaldo Nataliamond, Michael S, MD  Cetirizine HCl 10 MG CAPS Take 1 capsule (10 mg total) by mouth daily for 10 days. 07/06/18 07/16/18  Cambryn Charters C, PA-C  famotidine (PEPCID) 20 MG tablet Take 1 tablet (20 mg total) by mouth 2 (two) times daily. 09/08/17  Renne Crigler, PA-C  HYDROcodone-homatropine [facility]) 5-1.5 MG/5ML syrup Take 5 mLs by mouth every 6 (six) hours as needed for cough. 07/06/18   Jarmar Rousseau C, PA-C  ondansetron (ZOFRAN ODT) 4 MG disintegrating tablet Take 1 tablet (4 mg total) by mouth every 8 (eight) hours as needed for nausea or vomiting. 07/06/18   Louna Rothgeb C, PA-C  predniSONE (DELTASONE) 50 MG tablet Take 1 tablet (50 mg total) by mouth daily for 5 days. 07/06/18 07/11/18  Ayub Kirsh, Junius Creamer, PA-C    Family History Family History  Problem Relation Age of Onset  . Anxiety disorder Father   . Hypertension Father   . Bipolar disorder Father   . Anxiety disorder Paternal Grandmother   . Depression Paternal Grandmother   . Diabetes Paternal Grandmother   . Hypertension Mother   . Endometriosis Mother   . Heart disease Maternal Grandmother   . Fibroids Maternal Grandmother   . Osteoporosis  Maternal Grandmother   . Diabetes Paternal Grandfather   . Hypertension Maternal Grandfather     Social History Social History   Tobacco Use  . Smoking status: Former Games developer  . Smokeless tobacco: Never Used  Substance Use Topics  . Alcohol use: Yes    Alcohol/week: 5.0 standard drinks    Types: 2 Cans of beer, 3 Standard drinks or equivalent per week    Comment: Patient states she drinks 23 oz beer per week  . Drug use: No     Allergies   Penicillins   Review of Systems Review of Systems  Constitutional: Negative for activity change, appetite change, chills, fatigue and fever.  HENT: Positive for congestion, rhinorrhea and sore throat. Negative for ear pain, sinus pressure and trouble swallowing.   Eyes: Negative for discharge and redness.  Respiratory: Positive for cough. Negative for chest tightness and shortness of breath.   Cardiovascular: Negative for chest pain.  Gastrointestinal: Positive for vomiting. Negative for abdominal pain, diarrhea and nausea.  Musculoskeletal: Negative for myalgias.  Skin: Negative for rash.  Neurological: Negative for dizziness, light-headedness and headaches.     Physical Exam Triage Vital Signs ED Triage Vitals  Enc Vitals Group     BP 07/06/18 1944 (!) 163/109     Pulse Rate 07/06/18 1944 89     Resp 07/06/18 1944 16     Temp 07/06/18 1944 98 F (36.7 C)     Temp Source 07/06/18 1944 Oral     SpO2 07/06/18 1944 99 %     Weight --      Height --      Head Circumference --      Peak Flow --      Pain Score 07/06/18 1945 3     Pain Loc --      Pain Edu? --      Excl. in GC? --    No data found.  Updated Vital Signs BP (!) 163/109 (BP Location: Right Arm)   Pulse 89   Temp 98 F (36.7 C) (Oral)   Resp 16   SpO2 99%   Visual Acuity Right Eye Distance:   Left Eye Distance:   Bilateral Distance:    Right Eye Near:   Left Eye Near:    Bilateral Near:     Physical Exam Vitals signs and nursing note reviewed.    Constitutional:      General: She is not in acute distress.    Appearance: She is well-developed.  HENT:     Head: Normocephalic  and atraumatic.     Ears:     Comments: Bilateral ears without tenderness to palpation of external auricle, tragus and mastoid, EAC's without erythema or swelling, TM's with good bony landmarks and cone of light. Non erythematous.    Mouth/Throat:     Comments: Oral mucosa pink and moist, no tonsillar enlargement or exudate. Posterior pharynx patent and nonerythematous, no uvula deviation or swelling. Normal phonation. Eyes:     Conjunctiva/sclera: Conjunctivae normal.  Neck:     Musculoskeletal: Neck supple.  Cardiovascular:     Rate and Rhythm: Normal rate and regular rhythm.     Heart sounds: No murmur.  Pulmonary:     Effort: Pulmonary effort is normal. No respiratory distress.     Breath sounds: Normal breath sounds.     Comments: Breathing comfortably at rest, CTABL, no wheezing, rales or other adventitious sounds auscultated  Frequent coughing throughout visit Abdominal:     Palpations: Abdomen is soft.     Tenderness: There is no abdominal tenderness.  Skin:    General: Skin is warm and dry.  Neurological:     Mental Status: She is alert.      UC Treatments / Results  Labs (all labs ordered are listed, but only abnormal results are displayed) Labs Reviewed - No data to display  EKG None  Radiology Dg Chest 2 View  Result Date: 07/06/2018 CLINICAL DATA:  Cough.  Cough not improving with antibiotics. EXAM: CHEST - 2 VIEW COMPARISON:  09/08/2017 FINDINGS: Lungs are clear. Heart and mediastinum are within normal limits. Trachea is midline. No pleural effusions. Bone structures are unremarkable. Negative for a pneumothorax. IMPRESSION: No active cardiopulmonary disease. Electronically Signed   By: Richarda Overlie M.D.   On: 07/06/2018 20:32    Procedures Procedures (including critical care time)  Medications Ordered in UC Medications - No  data to display  Initial Impression / Assessment and Plan / UC Course  I have reviewed the triage vital signs and the nursing notes.  Pertinent labs & imaging results that were available during my care of the patient were reviewed by me and considered in my medical decision making (see chart for details).     Chest x-ray negative for pneumonia at this time.  Will have patient continue doxycycline to cover sinusitis as well as atypical respiratory illness.  Will increase prednisone to 50 daily, will provide Hycodan to help with cough at nighttime.  Recommended initiating daily allergy pill to help with congestion and drainage.  Provided Zofran to use prior to taking antibiotic as well as as needed for any nausea.  Continue to monitor symptoms,Discussed strict return precautions. Patient verbalized understanding and is agreeable with plan.  Final Clinical Impressions(s) / UC Diagnoses   Final diagnoses:  Lower respiratory infection (e.g., bronchitis, pneumonia, pneumonitis, pulmonitis)     Discharge Instructions     I want to increase your prednisone from 20 mg daily to 50 mg daily I have sent in hycodan cough syrup to use as needed-this will cause drowsiness, please limit use to at home or bedtime, do not drive or work after taking You may also try starting daily allergy pill like Zyrtec or Claritin to help with any congestion and drainage contributing to cough-I have sent in the generic of Zyrtec, you may get this over-the-counter if it is cheaper    ED Prescriptions    Medication Sig Dispense Auth. Provider   predniSONE (DELTASONE) 50 MG tablet Take 1 tablet (50 mg total) by  mouth daily for 5 days. 5 tablet Donnamaria Shands C, PA-C   HYDROcodone-homatropine (HYCODAN) 5-1.5 MG/5ML syrup Take 5 mLs by mouth every 6 (six) hours as needed for cough. 100 mL Shaneil Yazdi C, PA-C   ondansetron (ZOFRAN ODT) 4 MG disintegrating tablet Take 1 tablet (4 mg total) by mouth every 8 (eight) hours  as needed for nausea or vomiting. 20 tablet Zed Wanninger C, PA-C   Cetirizine HCl 10 MG CAPS Take 1 capsule (10 mg total) by mouth daily for 10 days. 10 capsule Lamichael Youkhana C, PA-C     Controlled Substance Prescriptions [place] Controlled Substance Registry consulted? No   Lew Dawes, PA-C 07/07/18 1030

## 2018-09-13 DIAGNOSIS — F331 Major depressive disorder, recurrent, moderate: Secondary | ICD-10-CM | POA: Diagnosis not present

## 2018-09-13 DIAGNOSIS — F411 Generalized anxiety disorder: Secondary | ICD-10-CM | POA: Diagnosis not present

## 2018-12-23 DIAGNOSIS — Z20828 Contact with and (suspected) exposure to other viral communicable diseases: Secondary | ICD-10-CM | POA: Diagnosis not present

## 2019-03-22 DIAGNOSIS — Z6841 Body Mass Index (BMI) 40.0 and over, adult: Secondary | ICD-10-CM | POA: Diagnosis not present

## 2019-03-22 DIAGNOSIS — E282 Polycystic ovarian syndrome: Secondary | ICD-10-CM | POA: Diagnosis not present

## 2019-03-22 DIAGNOSIS — F419 Anxiety disorder, unspecified: Secondary | ICD-10-CM | POA: Diagnosis not present

## 2019-03-22 DIAGNOSIS — R350 Frequency of micturition: Secondary | ICD-10-CM | POA: Diagnosis not present

## 2019-06-28 DIAGNOSIS — Z20828 Contact with and (suspected) exposure to other viral communicable diseases: Secondary | ICD-10-CM | POA: Diagnosis not present

## 2020-03-26 DIAGNOSIS — R05 Cough: Secondary | ICD-10-CM | POA: Diagnosis not present

## 2020-03-26 DIAGNOSIS — Z9109 Other allergy status, other than to drugs and biological substances: Secondary | ICD-10-CM | POA: Diagnosis not present

## 2020-03-26 DIAGNOSIS — Z8709 Personal history of other diseases of the respiratory system: Secondary | ICD-10-CM | POA: Diagnosis not present

## 2020-04-02 IMAGING — DX DG CHEST 2V
2 series · 2 of 2 positions shown · non-contrast
Comparison: 09/08/2017

CLINICAL DATA: Cough.  Cough not improving with antibiotics.

EXAM:
CHEST - 2 VIEW

[chest pa]
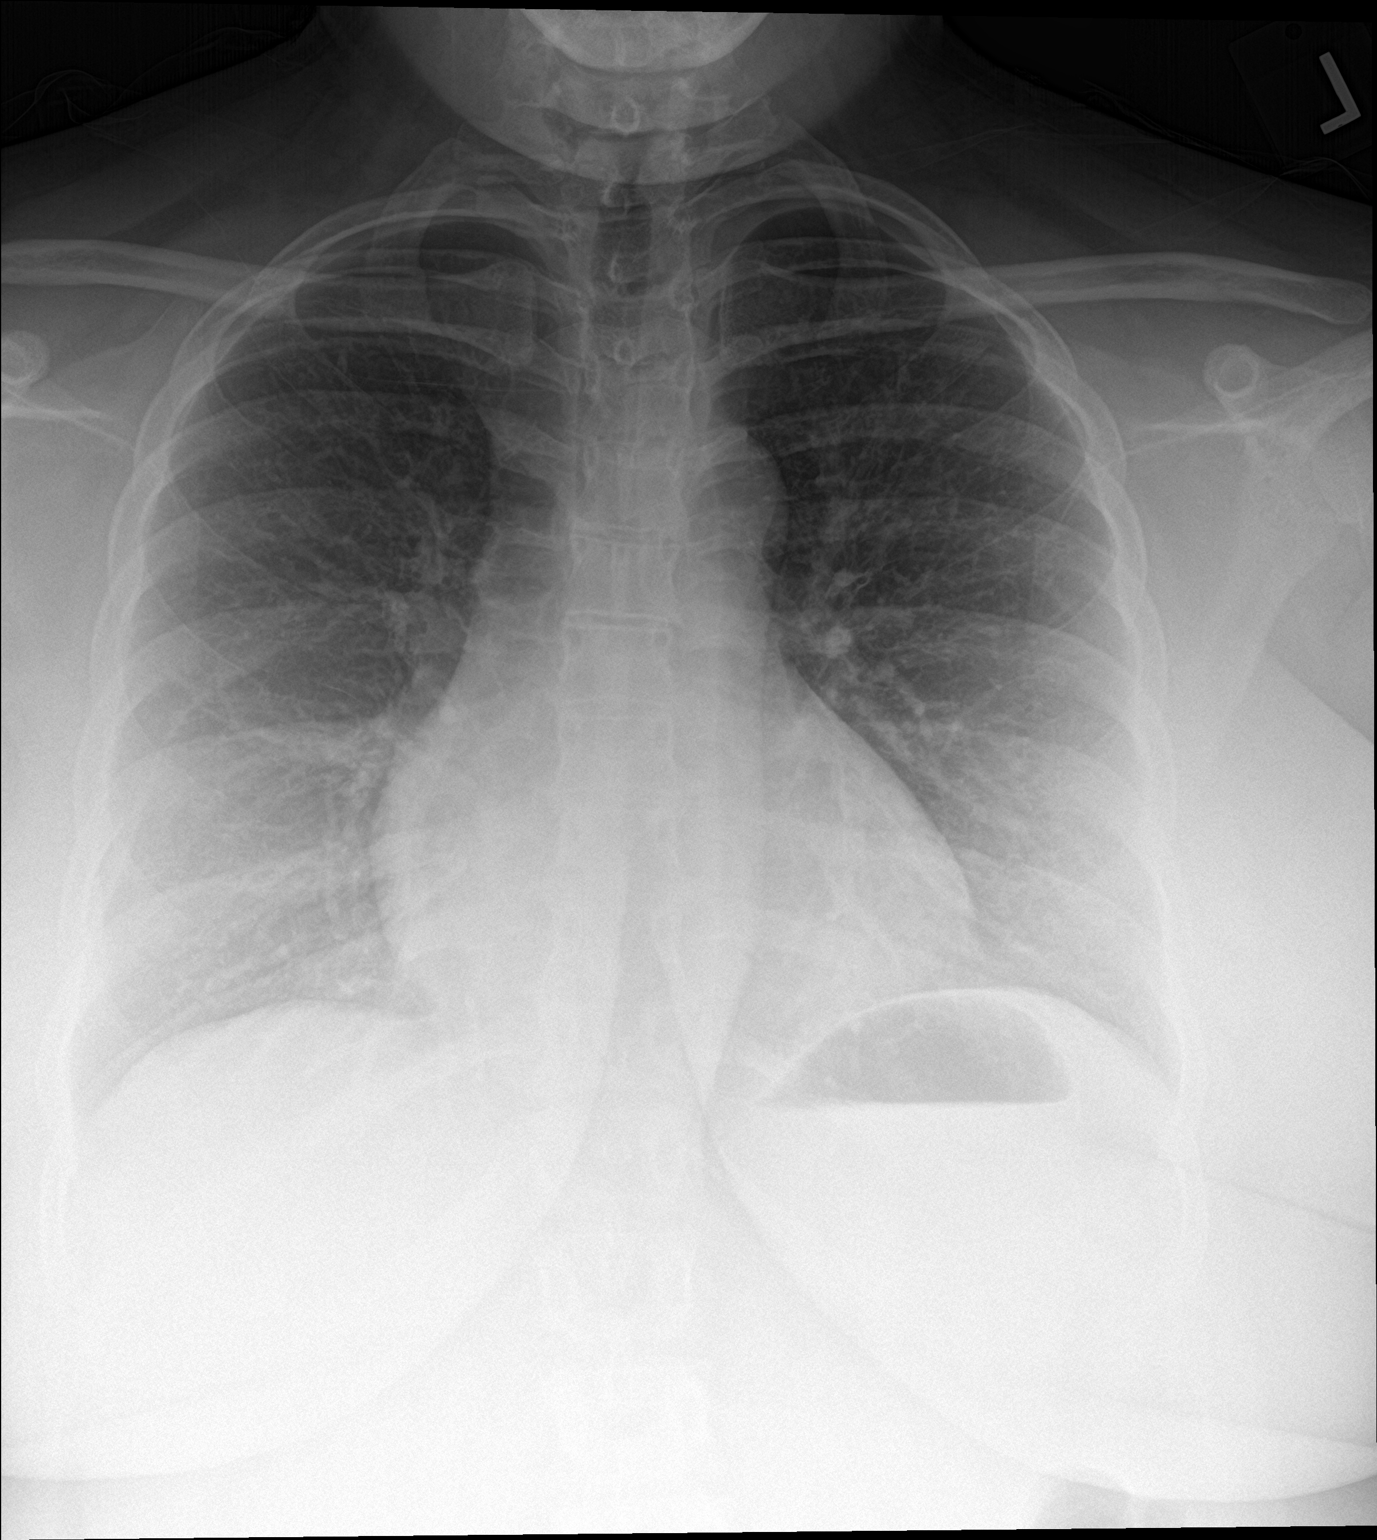

[chest lat]
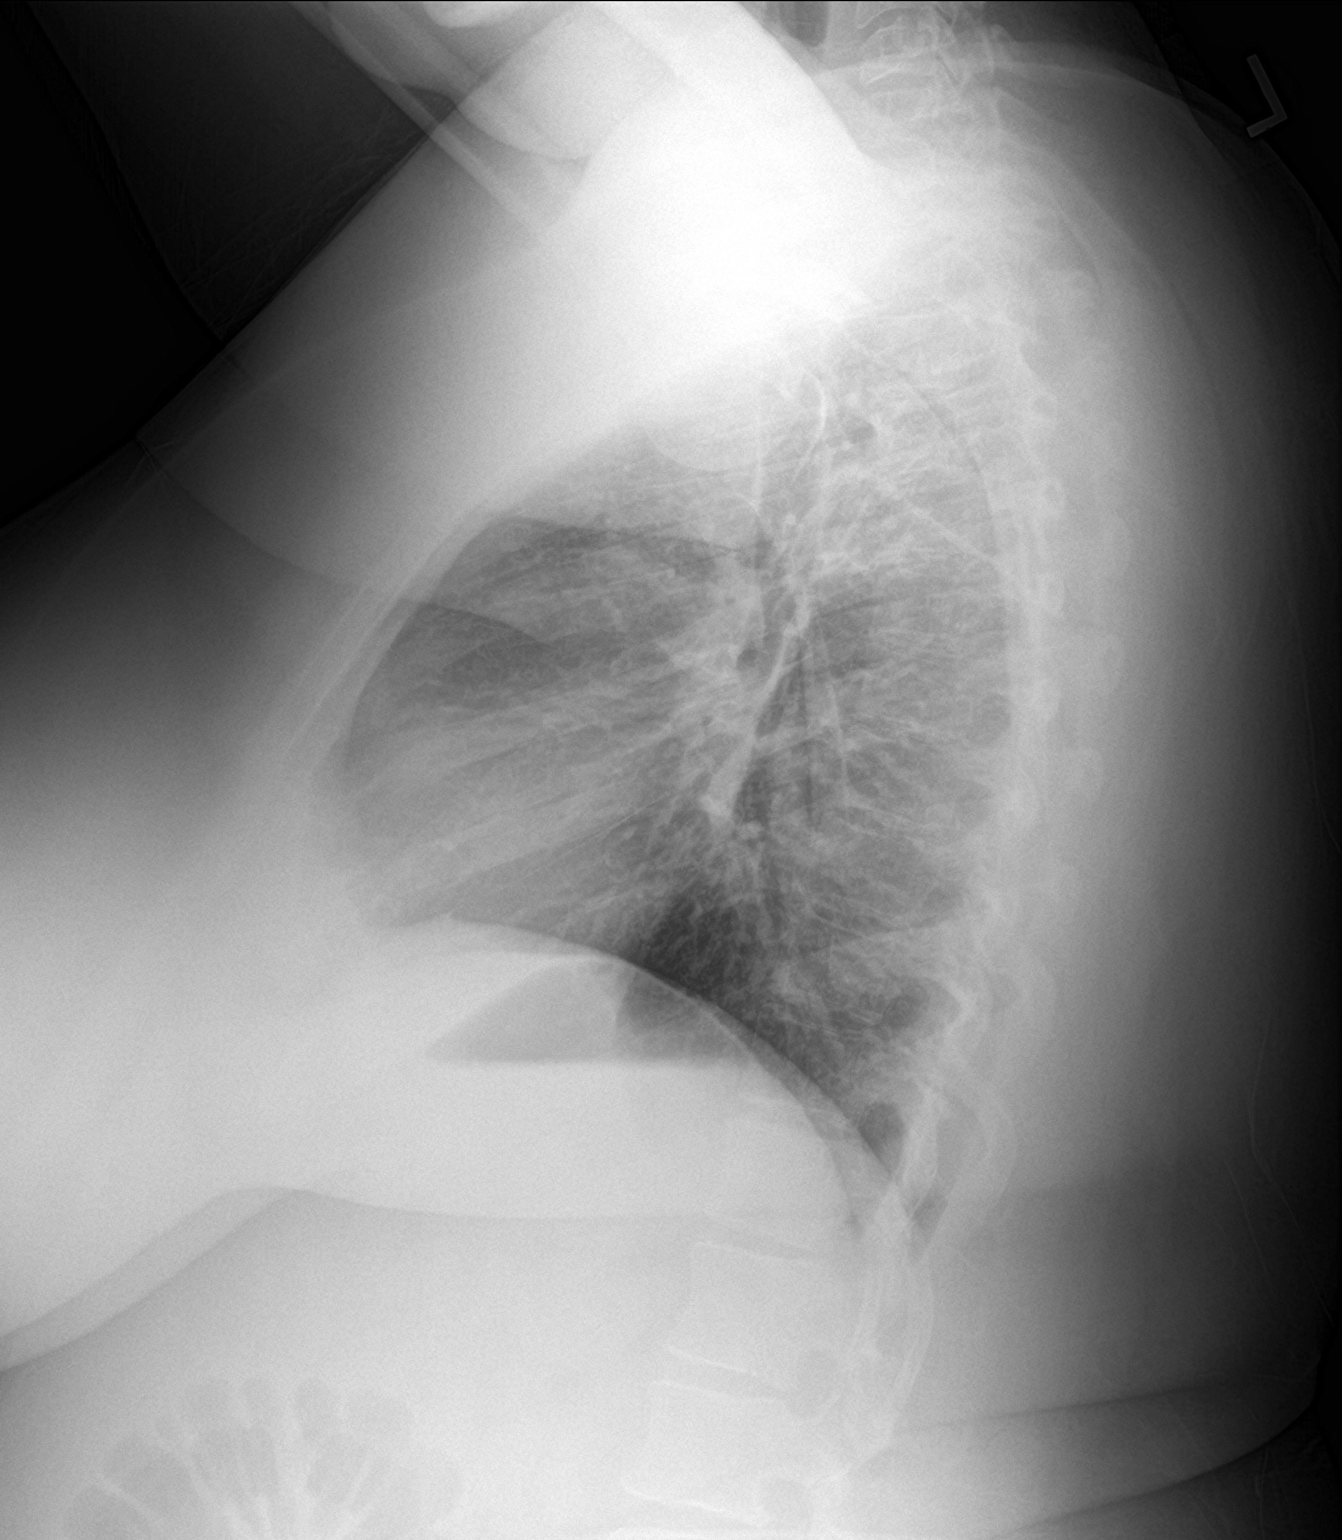

[2 of 2 positions shown; findings below may reference images not displayed]

FINDINGS: Lungs are clear. Heart and mediastinum are within normal limits.
Trachea is midline. No pleural effusions. Bone structures are
unremarkable. Negative for a pneumothorax.
IMPRESSION: No active cardiopulmonary disease.

## 2020-06-23 ENCOUNTER — Ambulatory Visit
Admission: EM | Admit: 2020-06-23 | Discharge: 2020-06-23 | Disposition: A | Discharge status: Home / Self Care | Payer: BC Managed Care – PPO | Attending: Urgent Care | Admitting: Urgent Care

## 2020-06-23 ENCOUNTER — Encounter: Payer: Self-pay | Admitting: Urgent Care

## 2020-06-23 ENCOUNTER — Other Ambulatory Visit: Payer: Self-pay

## 2020-06-23 DIAGNOSIS — R062 Wheezing: Secondary | ICD-10-CM

## 2020-06-23 DIAGNOSIS — J069 Acute upper respiratory infection, unspecified: Secondary | ICD-10-CM

## 2020-06-23 DIAGNOSIS — R0982 Postnasal drip: Secondary | ICD-10-CM

## 2020-06-23 MED ORDER — PREDNISONE 20 MG PO TABS: ORAL_TABLET | ORAL | 0 refills | Status: DC

## 2020-06-23 MED ORDER — BENZONATATE 100 MG PO CAPS: 100.0000 mg | ORAL_CAPSULE | Freq: Three times a day (TID) | ORAL | 0 refills | Status: DC | PRN

## 2020-06-23 NOTE — ED Provider Notes (Signed)
[facility]   MRN: [id number] DOB: 09/08/1990  Subjective:   Yolanda Callahan is a 29 y.o. female presenting for 1 week history of persistent body aches, chills, cough, intermittent shortness of breath.  Patient has been using her albuterol inhaler multiple times a day.  Had a COVID-19 test that was negative, result received yesterday.  She is Covid vaccinated and has gotten her booster shot two.  No current facility-administered medications for this encounter.  Current Outpatient Medications:  .  albuterol (PROVENTIL HFA;VENTOLIN HFA) 108 (90 Base) MCG/ACT inhaler, Inhale 2 puffs into the lungs every 4 (four) hours as needed for wheezing or shortness of breath., Disp: 18 g, Rfl: 3 .  buPROPion (WELLBUTRIN XL) 300 MG 24 hr tablet, Take 1 tablet (300 mg total) by mouth every morning., Disp: 30 tablet, Rfl: 3 .  busPIRone (BUSPAR) 10 MG tablet, Take 1 tablet (10 mg total) by mouth 2 (two) times daily., Disp: 60 tablet, Rfl: 3 .  Cetirizine HCl 10 MG CAPS, Take 1 capsule (10 mg total) by mouth daily for 10 days., Disp: 10 capsule, Rfl: 0 .  clonazePAM (KLONOPIN) 1 MG tablet, Take 1 mg by mouth 2 (two) times daily., Disp: , Rfl:  .  desogestrel-ethinyl estradiol (VIORELE) 0.15-0.02/0.01 MG (21/5) tablet, Take 1 tablet by mouth daily., Disp: 3 Package, Rfl: 3   Allergies  Allergen Reactions  . Penicillins Hives    Past Medical History:  Diagnosis Date  . Anxiety   . Asthma   . Chlamydia contact, treated 07/2013   treated 2014, 2015  . CIN I (cervical intraepithelial neoplasia I) 2012  . Depression   . Polycystic ovarian disease   . Seasonal allergies      Past Surgical History:  Procedure Laterality Date  . COLPOSCOPY  2012  . MOUTH SURGERY  2008  . WISDOM TOOTH EXTRACTION  06/2013    Family History  Problem Relation Age of Onset  . Anxiety disorder Father   . Hypertension Father   . Bipolar disorder Father   . Anxiety disorder Paternal Grandmother   .  Depression Paternal Grandmother   . Diabetes Paternal Grandmother   . Hypertension Mother   . Endometriosis Mother   . Heart disease Maternal Grandmother   . Fibroids Maternal Grandmother   . Osteoporosis Maternal Grandmother   . Diabetes Paternal Grandfather   . Hypertension Maternal Grandfather     Social History   Tobacco Use  . Smoking status: Former Games developer  . Smokeless tobacco: Never Used  Substance Use Topics  . Alcohol use: Yes    Alcohol/week: 5.0 standard drinks    Types: 2 Cans of beer, 3 Standard drinks or equivalent per week    Comment: Patient states she drinks 23 oz beer per week  . Drug use: No    ROS   Objective:   Vitals: BP 134/87 (BP Location: Left Arm)   Pulse (!) 110   Temp 99.2 F (37.3 C) (Oral)   Resp 20   SpO2 95%   Physical Exam Constitutional:      General: She is not in acute distress.    Appearance: Normal appearance. She is well-developed. She is obese. She is not ill-appearing, toxic-appearing or diaphoretic.  HENT:     Head: Normocephalic and atraumatic.     Nose: Nose normal.     Mouth/Throat:     Mouth: Mucous membranes are moist.     Pharynx: No oropharyngeal exudate or posterior oropharyngeal erythema.  Comments: Thick streaks of postnasal drainage overlying pharynx. Eyes:     General: No scleral icterus.       Right eye: No discharge.        Left eye: No discharge.     Extraocular Movements: Extraocular movements intact.     Conjunctiva/sclera: Conjunctivae normal.     Pupils: Pupils are equal, round, and reactive to light.  Cardiovascular:     Rate and Rhythm: Normal rate and regular rhythm.     Pulses: Normal pulses.     Heart sounds: Normal heart sounds. No murmur heard.  No friction rub. No gallop.   Pulmonary:     Effort: Pulmonary effort is normal. No respiratory distress.     Breath sounds: No stridor. Wheezing (mild over mid to lower lung fields) present. No rhonchi or rales.  Skin:    General: Skin is  warm and dry.     Findings: No rash.  Neurological:     Mental Status: She is alert and oriented to person, place, and time.  Psychiatric:        Mood and Affect: Mood normal.        Behavior: Behavior normal.        Thought Content: Thought content normal.        Judgment: Judgment normal.      Assessment and Plan :   PDMP not reviewed this encounter.  1. Viral URI with cough   2. Wheezing   3. Post-nasal drip     Deferred COVID-19 testing as she just had this done and result was given yesterday.  Recommended oral prednisone course in light of her asthma.  Supportive care otherwise. Counseled patient on potential for adverse effects with medications prescribed/recommended today, ER and return-to-clinic precautions discussed, patient verbalized understanding.    Wallis Bamberg, PA-C 06/23/20 1300

## 2020-06-23 NOTE — ED Triage Notes (Signed)
Patient complains of body aches, chills, cough, congestion x 1 week. Pt states she had a rapid covid test yesterday that was negative. Pt states she is using her inhaler more than usual. Pt is aox4 and ambulatory.

## 2020-06-27 ENCOUNTER — Ambulatory Visit: Payer: Self-pay

## 2020-09-05 ENCOUNTER — Emergency Department (HOSPITAL_COMMUNITY)
Admission: EM | Admit: 2020-09-05 | Discharge: 2020-09-05 | Disposition: A | Discharge status: Other Acute Inpatient Hospital | Payer: BC Managed Care – PPO | Attending: Emergency Medicine | Admitting: Emergency Medicine

## 2020-09-05 ENCOUNTER — Encounter (HOSPITAL_COMMUNITY): Payer: Self-pay | Admitting: Emergency Medicine

## 2020-09-05 ENCOUNTER — Other Ambulatory Visit: Payer: Self-pay

## 2020-09-05 ENCOUNTER — Ambulatory Visit (HOSPITAL_COMMUNITY)
Admission: EM | Admit: 2020-09-05 | Discharge: 2020-09-06 | Disposition: A | Discharge status: Home / Self Care | Payer: BC Managed Care – PPO | Attending: Psychiatry | Admitting: Psychiatry

## 2020-09-05 DIAGNOSIS — R9431 Abnormal electrocardiogram [ECG] [EKG]: Secondary | ICD-10-CM | POA: Diagnosis not present

## 2020-09-05 DIAGNOSIS — T43592A Poisoning by other antipsychotics and neuroleptics, intentional self-harm, initial encounter: Secondary | ICD-10-CM | POA: Insufficient documentation

## 2020-09-05 DIAGNOSIS — J45909 Unspecified asthma, uncomplicated: Secondary | ICD-10-CM | POA: Diagnosis not present

## 2020-09-05 DIAGNOSIS — T43012A Poisoning by tricyclic antidepressants, intentional self-harm, initial encounter: Secondary | ICD-10-CM | POA: Diagnosis not present

## 2020-09-05 DIAGNOSIS — I10 Essential (primary) hypertension: Secondary | ICD-10-CM | POA: Diagnosis not present

## 2020-09-05 DIAGNOSIS — F29 Unspecified psychosis not due to a substance or known physiological condition: Secondary | ICD-10-CM | POA: Diagnosis not present

## 2020-09-05 DIAGNOSIS — F332 Major depressive disorder, recurrent severe without psychotic features: Secondary | ICD-10-CM

## 2020-09-05 DIAGNOSIS — T1491XA Suicide attempt, initial encounter: Secondary | ICD-10-CM | POA: Diagnosis not present

## 2020-09-05 DIAGNOSIS — Z20822 Contact with and (suspected) exposure to covid-19: Secondary | ICD-10-CM | POA: Diagnosis not present

## 2020-09-05 DIAGNOSIS — Z87891 Personal history of nicotine dependence: Secondary | ICD-10-CM | POA: Diagnosis not present

## 2020-09-05 DIAGNOSIS — F411 Generalized anxiety disorder: Secondary | ICD-10-CM

## 2020-09-05 DIAGNOSIS — T887XXA Unspecified adverse effect of drug or medicament, initial encounter: Secondary | ICD-10-CM | POA: Diagnosis not present

## 2020-09-05 LAB — CBC WITH DIFFERENTIAL/PLATELET
Abs Immature Granulocytes: 0.02 10*3/uL (ref 0.00–0.07)
Basophils Absolute: 0 10*3/uL (ref 0.0–0.1)
Basophils Relative: 0 %
Eosinophils Absolute: 0.1 10*3/uL (ref 0.0–0.5)
Eosinophils Relative: 2 %
HCT: 39.1 % (ref 36.0–46.0)
Hemoglobin: 11.8 g/dL — ABNORMAL LOW (ref 12.0–15.0)
Immature Granulocytes: 0 %
Lymphocytes Relative: 30 %
Lymphs Abs: 2.1 10*3/uL (ref 0.7–4.0)
MCH: 24.7 pg — ABNORMAL LOW (ref 26.0–34.0)
MCHC: 30.2 g/dL (ref 30.0–36.0)
MCV: 82 fL (ref 80.0–100.0)
Monocytes Absolute: 0.5 10*3/uL (ref 0.1–1.0)
Monocytes Relative: 7 %
Neutro Abs: 4.3 10*3/uL (ref 1.7–7.7)
Neutrophils Relative %: 61 %
Platelets: 332 10*3/uL (ref 150–400)
RBC: 4.77 MIL/uL (ref 3.87–5.11)
RDW: 17.2 % — ABNORMAL HIGH (ref 11.5–15.5)
WBC: 7 10*3/uL (ref 4.0–10.5)
nRBC: 0 % (ref 0.0–0.2)

## 2020-09-05 LAB — ETHANOL: Alcohol, Ethyl (B): <10 mg/dL (ref <10)

## 2020-09-05 LAB — I-STAT BETA HCG BLOOD, ED (MC, WL, AP ONLY): I-stat hCG, quantitative: <5 m[IU]/mL (ref <5)

## 2020-09-05 LAB — RAPID URINE DRUG SCREEN, HOSP PERFORMED
Amphetamines: NOT DETECTED
Barbiturates: NOT DETECTED
Benzodiazepines: NOT DETECTED
Cocaine: NOT DETECTED
Opiates: NOT DETECTED
Tetrahydrocannabinol: POSITIVE — AB

## 2020-09-05 LAB — COMPREHENSIVE METABOLIC PANEL
ALT: 24 U/L (ref 0–44)
AST: 28 U/L (ref 15–41)
Albumin: 3.7 g/dL (ref 3.5–5.0)
Alkaline Phosphatase: 77 U/L (ref 38–126)
Anion gap: 10 (ref 5–15)
BUN: 15 mg/dL (ref 6–20)
CO2: 22 mmol/L (ref 22–32)
Calcium: 9.1 mg/dL (ref 8.9–10.3)
Chloride: 108 mmol/L (ref 98–111)
Creatinine, Ser: 0.83 mg/dL (ref 0.44–1.00)
GFR, Estimated: >60 mL/min (ref >60)
Glucose, Bld: 86 mg/dL (ref 70–99)
Potassium: 4 mmol/L (ref 3.5–5.1)
Sodium: 140 mmol/L (ref 135–145)
Total Bilirubin: 0.6 mg/dL (ref 0.3–1.2)
Total Protein: 7.4 g/dL (ref 6.5–8.1)

## 2020-09-05 LAB — SALICYLATE LEVEL: Salicylate Lvl: <7 mg/dL — ABNORMAL LOW (ref 7.0–30.0)

## 2020-09-05 LAB — RESP PANEL BY RT-PCR (FLU A&B, COVID) ARPGX2
Influenza A by PCR: NEGATIVE
Influenza B by PCR: NEGATIVE
SARS Coronavirus 2 by RT PCR: NEGATIVE

## 2020-09-05 LAB — ACETAMINOPHEN LEVEL: Acetaminophen (Tylenol), Serum: <10 ug/mL — ABNORMAL LOW (ref 10–30)

## 2020-09-05 MED ORDER — MAGNESIUM HYDROXIDE 400 MG/5ML PO SUSP: 30.0000 mL | Freq: Every day | ORAL | Status: DC | PRN

## 2020-09-05 MED ORDER — ALUM & MAG HYDROXIDE-SIMETH 200-200-20 MG/5ML PO SUSP: 30.0000 mL | ORAL | Status: DC | PRN

## 2020-09-05 MED ORDER — BUPROPION HCL ER (XL) 300 MG PO TB24
300.0000 mg | ORAL_TABLET | Freq: Every day | ORAL | Status: DC
  Administered 2020-09-06: 300 mg via ORAL
  Filled 2020-09-05: qty 1

## 2020-09-05 MED ORDER — BUSPIRONE HCL 5 MG PO TABS
10.0000 mg | ORAL_TABLET | Freq: Two times a day (BID) | ORAL | Status: DC
  Administered 2020-09-05 – 2020-09-06 (×2): 10 mg via ORAL
  Filled 2020-09-05 (×2): qty 2

## 2020-09-05 MED ORDER — ACETAMINOPHEN 325 MG PO TABS: 650.0000 mg | ORAL_TABLET | Freq: Four times a day (QID) | ORAL | Status: DC | PRN

## 2020-09-05 NOTE — ED Provider Notes (Signed)
[facility]-EMERGENCY DEPT Provider Note   CSN: [id number] Arrival date & time: 09/05/20  2841     History Chief Complaint  Patient presents with  . Ingestion    Yolanda Callahan is a 30 y.o. female.  HPI     29yo female with history of depression presents with concern for suicidal ideation and attempted overdose.    Reports she began having increased depression and SI over the last 4 days related to stress and her relationship with her boyfriend.  Reports Monday she took all of her wellbutrin and buspar--everything left in the bottles (?20 pills) but threw everything up.  She had physically felt well the last few days then had increased SI again last night and took 10mg  of CBD oil around 6PM.  Feels sleepy ath this point but denies other medical symptoms.  Reports she and her family were trying to get in touch with her psychiatrist for admission given depression with SI and in crisis but were not able to get an appointment until March 3 and given worsening symptoms and history of ingestion she was instructed to come to the ED for evaluation.  She reports at this moment she is feeling better mentally as she has good support system but is worried about the degree of symptoms she has had over the last few days. She had prior admission for SI at age of 61. Denies current SI. Father has history of bipolar and feels she may also have bipolar.     Past Medical History:  Diagnosis Date  . Anxiety   . Asthma   . Chlamydia contact, treated 07/2013   treated 2014, 2015  . CIN I (cervical intraepithelial neoplasia I) 2012  . Depression   . Polycystic ovarian disease   . Seasonal allergies     Patient Active Problem List   Diagnosis Date Noted  . H/O chlamydia infection 01/17/2014  . MDD (major depressive disorder) 10/27/2012  . CIN I (cervical intraepithelial neoplasia I)   . GAD (generalized anxiety disorder) 07/23/2011    Past Surgical History:  Procedure  Laterality Date  . COLPOSCOPY  2012  . MOUTH SURGERY  2008  . WISDOM TOOTH EXTRACTION  06/2013     OB History    Gravida  0   Para      Term      Preterm      AB      Living        SAB      IAB      Ectopic      Multiple      Live Births              Family History  Problem Relation Age of Onset  . Anxiety disorder Father   . Hypertension Father   . Bipolar disorder Father   . Anxiety disorder Paternal Grandmother   . Depression Paternal Grandmother   . Diabetes Paternal Grandmother   . Hypertension Mother   . Endometriosis Mother   . Heart disease Maternal Grandmother   . Fibroids Maternal Grandmother   . Osteoporosis Maternal Grandmother   . Diabetes Paternal Grandfather   . Hypertension Maternal Grandfather     Social History   Tobacco Use  . Smoking status: Former 07/2013  . Smokeless tobacco: Never Used  Vaping Use  . Vaping Use: Never used  Substance Use Topics  . Alcohol use: Yes    Alcohol/week: 5.0 standard drinks    Types:  2 Cans of beer, 3 Standard drinks or equivalent per week    Comment: Patient states she drinks 23 oz beer per week  . Drug use: No    Home Medications Prior to Admission medications   Medication Sig Start Date End Date Taking? Authorizing Provider  albuterol (PROVENTIL HFA;VENTOLIN HFA) 108 (90 Base) MCG/ACT inhaler Inhale 2 puffs into the lungs every 4 (four) hours as needed for wheezing or shortness of breath. 07/04/18  Yes Arnaldo Natal, MD  buPROPion (WELLBUTRIN XL) 300 MG 24 hr tablet Take 1 tablet (300 mg total) by mouth every morning. 04/23/14  Yes Nelly Rout, MD  busPIRone (BUSPAR) 10 MG tablet Take 1 tablet (10 mg total) by mouth 2 (two) times daily. 04/23/14  Yes Nelly Rout, MD  clonazePAM (KLONOPIN) 1 MG tablet Take 1 mg by mouth at bedtime as needed for anxiety.   Yes [provider]  desogestrel-ethinyl estradiol (MIRCETTE) 0.15-0.02/0.01 MG (21/5) tablet Take 1 tablet by mouth  daily.   Yes [provider]  ELDERBERRY PO Take 1 tablet by mouth daily.   Yes [provider]  methylphenidate 18 MG PO CR tablet Take 18 mg by mouth every morning. 06/24/20  Yes [provider]  Multiple Vitamins-Minerals (ONE-A-DAY WOMENS PO) Take 1 tablet by mouth daily.   Yes [provider]  benzonatate (TESSALON) 100 MG capsule Take 1-2 capsules (100-200 mg total) by mouth 3 (three) times daily as needed. Patient not taking: Reported on 09/05/2020 06/23/20   Wallis Bamberg, PA-C  Cetirizine HCl 10 MG CAPS Take 1 capsule (10 mg total) by mouth daily for 10 days. Patient not taking: Reported on 09/05/2020 07/06/18 07/16/18  Wieters, Junius Creamer, PA-C  desogestrel-ethinyl estradiol (VIORELE) 0.15-0.02/0.01 MG (21/5) tablet Take 1 tablet by mouth daily. Patient not taking: Reported on 09/05/2020 08/26/15   Romualdo Bolk, MD  predniSONE (DELTASONE) 20 MG tablet Take 2 tablets daily with breakfast. Patient not taking: Reported on 09/05/2020 06/23/20   Wallis Bamberg, PA-C  famotidine (PEPCID) 20 MG tablet Take 1 tablet (20 mg total) by mouth 2 (two) times daily. 09/08/17 06/23/20  Renne Crigler, PA-C    Allergies    Penicillins  Review of Systems   Review of Systems  Constitutional: Positive for fatigue. Negative for fever.  HENT: Negative for sore throat.   Eyes: Negative for visual disturbance.  Respiratory: Negative for cough and shortness of breath.   Cardiovascular: Negative for chest pain.  Gastrointestinal: Negative for abdominal pain, nausea and vomiting.  Genitourinary: Negative for difficulty urinating.  Musculoskeletal: Negative for back pain and neck pain.  Skin: Negative for rash.  Neurological: Negative for syncope and headaches.  Psychiatric/Behavioral: Positive for self-injury and suicidal ideas (not active at this time but has had over last few days).    Physical Exam Updated Vital Signs BP 139/88   Pulse 62   Temp 98.2 F (36.8 C)    Resp 17   Ht 5' (1.524 m)   Wt 127 kg   LMP 08/06/2020 (Approximate)   SpO2 100%   BMI 54.68 kg/m   Physical Exam Vitals and nursing note reviewed.  Constitutional:      General: She is not in acute distress.    Appearance: She is well-developed and well-nourished. She is not diaphoretic.  HENT:     Head: Normocephalic and atraumatic.  Eyes:     Extraocular Movements: EOM normal.     Conjunctiva/sclera: Conjunctivae normal.  Cardiovascular:     Rate and  Rhythm: Normal rate and regular rhythm.     Pulses: Intact distal pulses.     Heart sounds: Normal heart sounds. No murmur heard. No friction rub. No gallop.   Pulmonary:     Effort: Pulmonary effort is normal. No respiratory distress.     Breath sounds: Normal breath sounds. No wheezing or rales.  Abdominal:     General: There is no distension.     Palpations: Abdomen is soft.     Tenderness: There is no abdominal tenderness. There is no guarding.  Musculoskeletal:        General: No tenderness or edema.     Cervical back: Normal range of motion.  Skin:    General: Skin is warm and dry.     Findings: No erythema or rash.  Neurological:     Mental Status: She is alert and oriented to person, place, and time.     ED Results / Procedures / Treatments   Labs (all labs ordered are listed, but only abnormal results are displayed) Labs Reviewed  CBC WITH DIFFERENTIAL/PLATELET - Abnormal; Notable for the following components:      Result Value   Hemoglobin 11.8 (*)    MCH 24.7 (*)    RDW 17.2 (*)    All other components within normal limits  ACETAMINOPHEN LEVEL - Abnormal; Notable for the following components:   Acetaminophen (Tylenol), Serum <10 (*)    All other components within normal limits  SALICYLATE LEVEL - Abnormal; Notable for the following components:   Salicylate Lvl <7.0 (*)    All other components within normal limits  RAPID URINE DRUG SCREEN, HOSP PERFORMED - Abnormal; Notable for the following  components:   Tetrahydrocannabinol POSITIVE (*)    All other components within normal limits  RESP PANEL BY RT-PCR (FLU A&B, COVID) ARPGX2  COMPREHENSIVE METABOLIC PANEL  ETHANOL  I-STAT BETA HCG BLOOD, ED ([facility], [facility], [facility] ONLY)    EKG EKG Interpretation  Date/Time:  Thursday September 05 2020 09:52:10 EST Ventricular Rate:  90 PR Interval:    QRS Duration: 74 QT Interval:  365 QTC Calculation: 447 R Axis:   42 Text Interpretation: Sinus rhythm No significant change since last tracing Confirmed by Alvira MondaySchlossman, Nykiah Ma ([id number]) on 09/05/2020 11:42:10 AM   Radiology No results found.  Procedures Procedures   Medications Ordered in ED Medications - No data to display  ED Course  I have reviewed the triage vital signs and the nursing notes.  Pertinent labs & imaging results that were available during my care of the patient were reviewed by me and considered in my medical decision making (see chart for details).    MDM Rules/Calculators/A&P                          29yo female with history of depression presents with concern for suicidal ideation and attempted overdose.   4 days ago took the remaining medications of wellbutrin and buspirone, however vomited after she ingested them and did not have symptoms--then took overdose of CBD oil last night.  Labs and ECG obtained and she is medically cleared. Contacted poison control.  Psychiatry consulted.  Patient with good insight at this time, voluntary with good support system.  [facility]    Final Clinical Impression(s) / ED Diagnoses Final diagnoses:  Suicide attempt [facility](HCC)    Rx / DC Orders ED Discharge Orders    None       Kely Dohn,  Denny Peon, MD 09/05/20 2104

## 2020-09-05 NOTE — ED Notes (Signed)
Pt sleeping@this time. Breathing even and unlabored. Will continue to monitor for safety 

## 2020-09-05 NOTE — ED Triage Notes (Addendum)
Patient BIBA from home, reporting SI x3 days. States she attempted to ingest her medications and was unsuccessful. She states that last night she ingested 5x suggested dose of CBD oil/Delta-8 (she reports approximately 10 mg of oil). EMS reports she is AOx4 but feels "out of it".   BP 130/90 P 86 RR 18 CBG 97

## 2020-09-05 NOTE — ED Notes (Signed)
Pt sleeping at present, no distress noted.  Monitoring for safety. 

## 2020-09-05 NOTE — Progress Notes (Signed)
Pt is a direct admit from [facility] and is in OBS bed 5 due to SI attempt via overdose. Pt independently ambulated to the unit. Pt is alert, oriented, calm and cooperative. Pt is oriented to unit and staff. Skin is assessed. Pt denies pain, SI, HI and AVH at this time.

## 2020-09-05 NOTE — ED Provider Notes (Signed)
Behavioral Health Admission H&P [facility] & OBS)  Date: 09/05/20 Patient Name: Yolanda Callahan MRN: [id number] Chief Complaint: No chief complaint on file.     Diagnoses:  Final diagnoses:  Severe episode of recurrent major depressive disorder, without psychotic features (HCC)    HPI: Patient medically cleared in [facility] emergency department.  Patient reports feeling suicidal approximately 3 days ago when she took more than prescribed Wellbutrin and BuSpar.  Patient reports this was not a suicide attempt.  Patient reports trigger was boyfriend who did not acknowledge the recent Valentine's Day.  Patient reports history of 3 prior suicide attempts.  Patient reports history of cutting, last episode of cutting in 2020.  Patient reports she has been diagnosed with anxiety and depression in the past.  Patient is currently followed by Leone Payor with neuropsychiatric care center.  Patient reports she would like to consider alternative outpatient follow-up as she has spoken with her provider several times about the fact that she wants to "get rediagnosed."  Patient also reports frustration that she called to schedule an urgent appointment and was scheduled for September 19, 2020.  Patient reports compliance with current medication including BuSpar, and Wellbutrin.  Patient reports she is also prescribed Klonopin as needed, reports she takes these medications daily.  Patient UDS on arrival negative for benzodiazepine medication. Patient reports she was prescribed methylphenidate in August 2021 for ADHD, patient reports "that is when all the confusion began."  Patient reports she was not tested for ADHD and believes her mood swings may be related.  Patient resides in [place] with her boyfriend but reports plan to discharge to her parents home.  Patient denies access to weapons.  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  Patient endorses alcohol use approximately 1-2 drinks per week.   Patient endorses marijuana use every day in the form of edible marijuana.  Patient endorses sleep approximately 6 hours per night.  Patient endorses average appetite.  Patient assessed by nurse practitioner.  Patient alert and oriented, answers appropriately.  Patient pleasant and cooperative.  Patient denies homicidal ideations.  Patient denies auditory and visual hallucinations.  Patient denies symptoms of paranoia.  Patient offered support and encouragement.    PHQ 2-9:   Flowsheet Row ED from 09/05/2020 in [facility]  C-SSRS RISK CATEGORY High Risk       Total Time spent with patient: 30 minutes  Musculoskeletal  Strength & Muscle Tone: within normal limits Gait & Station: normal Patient leans: N/A  Psychiatric Specialty Exam  Presentation General Appearance: Casual; Appropriate for Environment  Eye Contact:Good  Speech:Clear and Coherent; Normal Rate  Speech Volume:Normal  Handedness:Right   Mood and Affect  Mood:Euthymic  Affect:Appropriate; Congruent   Thought Process  Thought Processes:Goal Directed; Coherent  Descriptions of Associations:Intact  Orientation:Full (Time, Place and Person)  Thought Content:Logical; WDL  Hallucinations:Hallucinations: None  Ideas of Reference:None  Suicidal Thoughts:Suicidal Thoughts: No  Homicidal Thoughts:Homicidal Thoughts: No   Sensorium  Memory:Immediate Good; Recent Good; Remote Good  Judgment:Good  Insight:Fair   Executive Functions  Concentration:Good  Attention Span:Good  Recall:Good  Fund of Knowledge:Good  Language:Good   Psychomotor Activity  Psychomotor Activity:Psychomotor Activity: Normal   Assets  Assets:Communication Skills; Financial Resources/Insurance; Housing; Intimacy; Leisure Time; Physical Health; Desire for Improvement; Resilience; Social Support; Transportation; Talents/Skills; Vocational/Educational   Sleep  Sleep:Sleep:  Fair Number of Hours of Sleep: 6   Physical Exam Vitals and nursing note reviewed.  Constitutional:  Appearance: She is well-developed.  HENT:     Head: Normocephalic.  Cardiovascular:     Rate and Rhythm: Normal rate.  Pulmonary:     Effort: Pulmonary effort is normal.  Neurological:     Mental Status: She is alert and oriented to person, place, and time.  Psychiatric:        Attention and Perception: Attention and perception normal.        Mood and Affect: Mood and affect normal.        Speech: Speech normal.        Behavior: Behavior normal. Behavior is cooperative.        Thought Content: Thought content normal.        Cognition and Memory: Cognition and memory normal.        Judgment: Judgment normal.    Review of Systems  Constitutional: Negative.   HENT: Negative.   Eyes: Negative.   Respiratory: Negative.   Cardiovascular: Negative.   Gastrointestinal: Negative.   Genitourinary: Negative.   Musculoskeletal: Negative.   Skin: Negative.   Neurological: Negative.   Endo/Heme/Allergies: Negative.   Psychiatric/Behavioral: Positive for substance abuse.    Last menstrual period 08/06/2020. There is no height or weight on file to calculate BMI.  Past Psychiatric History: Major depressive disorder, generalized anxiety disorder  Is the patient at risk to self? No  Has the patient been a risk to self in the past 6 months? Yes .    Has the patient been a risk to self within the distant past? Yes   Is the patient a risk to others? No   Has the patient been a risk to others in the past 6 months? No   Has the patient been a risk to others within the distant past? No   Past Medical History:  Past Medical History:  Diagnosis Date  . Anxiety   . Asthma   . Chlamydia contact, treated 07/2013   treated 2014, 2015  . CIN I (cervical intraepithelial neoplasia I) 2012  . Depression   . Polycystic ovarian disease   . Seasonal allergies     Past Surgical History:   Procedure Laterality Date  . COLPOSCOPY  2012  . MOUTH SURGERY  2008  . WISDOM TOOTH EXTRACTION  06/2013    Family History:  Family History  Problem Relation Age of Onset  . Anxiety disorder Father   . Hypertension Father   . Bipolar disorder Father   . Anxiety disorder Paternal Grandmother   . Depression Paternal Grandmother   . Diabetes Paternal Grandmother   . Hypertension Mother   . Endometriosis Mother   . Heart disease Maternal Grandmother   . Fibroids Maternal Grandmother   . Osteoporosis Maternal Grandmother   . Diabetes Paternal Grandfather   . Hypertension Maternal Grandfather     Social History:  Social History   Socioeconomic History  . Marital status: Single    Spouse name: Not on file  . Number of children: Not on file  . Years of education: Not on file  . Highest education level: Not on file  Occupational History  . Not on file  Tobacco Use  . Smoking status: Former Games developer  . Smokeless tobacco: Never Used  Vaping Use  . Vaping Use: Never used  Substance and Sexual Activity  . Alcohol use: Yes    Alcohol/week: 5.0 standard drinks    Types: 2 Cans of beer, 3 Standard drinks or equivalent per week    Comment: Patient  states she drinks 23 oz beer per week  . Drug use: No  . Sexual activity: Yes    Partners: Male    Birth control/protection: None  Other Topics Concern  . Not on file  Social History Narrative  . Not on file   Social Determinants of Health   Financial Resource Strain: Not on file  Food Insecurity: Not on file  Transportation Needs: Not on file  Physical Activity: Not on file  Stress: Not on file  Social Connections: Not on file  Intimate Partner Violence: Not on file    SDOH:  SDOH Screenings   Alcohol Screen: Not on file  Depression (WVP7-1): Not on file  Financial Resource Strain: Not on file  Food Insecurity: Not on file  Housing: Not on file  Physical Activity: Not on file  Social Connections: Not on file   Stress: Not on file  Tobacco Use: Medium Risk  . Smoking Tobacco Use: Former Smoker  . Smokeless Tobacco Use: Never Used  Transportation Needs: Not on file    Last Labs:  Admission on 09/05/2020, Discharged on 09/05/2020  Component Date Value Ref Range Status  . WBC 09/05/2020 7.0  4.0 - 10.5 K/uL Final  . RBC 09/05/2020 4.77  3.87 - 5.11 MIL/uL Final  . Hemoglobin 09/05/2020 11.8* 12.0 - 15.0 g/dL Final  . HCT 01/13/9484 39.1  36.0 - 46.0 % Final  . MCV 09/05/2020 82.0  80.0 - 100.0 fL Final  . MCH 09/05/2020 24.7* 26.0 - 34.0 pg Final  . MCHC 09/05/2020 30.2  30.0 - 36.0 g/dL Final  . RDW 46/27/0350 17.2* 11.5 - 15.5 % Final  . Platelets 09/05/2020 332  150 - 400 K/uL Final  . nRBC 09/05/2020 0.0  0.0 - 0.2 % Final  . Neutrophils Relative % 09/05/2020 61  % Final  . Neutro Abs 09/05/2020 4.3  1.7 - 7.7 K/uL Final  . Lymphocytes Relative 09/05/2020 30  % Final  . Lymphs Abs 09/05/2020 2.1  0.7 - 4.0 K/uL Final  . Monocytes Relative 09/05/2020 7  % Final  . Monocytes Absolute 09/05/2020 0.5  0.1 - 1.0 K/uL Final  . Eosinophils Relative 09/05/2020 2  % Final  . Eosinophils Absolute 09/05/2020 0.1  0.0 - 0.5 K/uL Final  . Basophils Relative 09/05/2020 0  % Final  . Basophils Absolute 09/05/2020 0.0  0.0 - 0.1 K/uL Final  . Immature Granulocytes 09/05/2020 0  % Final  . Abs Immature Granulocytes 09/05/2020 0.02  0.00 - 0.07 K/uL Final   Performed at [facility], [address] [place], [place] [address]  . Sodium 09/05/2020 140  135 - 145 mmol/L Final  . Potassium 09/05/2020 4.0  3.5 - 5.1 mmol/L Final  . Chloride 09/05/2020 108  98 - 111 mmol/L Final  . CO2 09/05/2020 22  22 - 32 mmol/L Final  . Glucose, Bld 09/05/2020 86  70 - 99 mg/dL Final   Glucose reference range applies only to samples taken after fasting for at least 8 hours.  . BUN 09/05/2020 15  6 - 20 mg/dL Final  . Creatinine, Ser 09/05/2020 0.83  0.44 - 1.00 mg/dL Final  . Calcium 82/99/3716  9.1  8.9 - 10.3 mg/dL Final  . Total Protein 09/05/2020 7.4  6.5 - 8.1 g/dL Final  . Albumin 96/78/9381 3.7  3.5 - 5.0 g/dL Final  . AST 01/75/1025 28  15 - 41 U/L Final  . ALT 09/05/2020 24  0 - 44 U/L Final  .  Alkaline Phosphatase 09/05/2020 77  38 - 126 U/L Final  . Total Bilirubin 09/05/2020 0.6  0.3 - 1.2 mg/dL Final  . GFR, Estimated 09/05/2020 >60  >60 mL/min Final   Comment: (NOTE) Calculated using the CKD-EPI Creatinine Equation (2021)   . Anion gap 09/05/2020 10  5 - 15 Final   Performed at [facility], [address] [place], [place] [address]  . SARS Coronavirus 2 by RT PCR 09/05/2020 NEGATIVE  NEGATIVE Final   Comment: (NOTE) SARS-CoV-2 target nucleic acids are NOT DETECTED.  The SARS-CoV-2 RNA is generally detectable in upper respiratory specimens during the acute phase of infection. The lowest concentration of SARS-CoV-2 viral copies this assay can detect is 138 copies/mL. A negative result does not preclude SARS-Cov-2 infection and should not be used as the sole basis for treatment or other patient management decisions. A negative result may occur with  improper specimen collection/handling, submission of specimen other than nasopharyngeal swab, presence of viral mutation(s) within the areas targeted by this assay, and inadequate number of viral copies(<138 copies/mL). A negative result must be combined with clinical observations, patient history, and epidemiological information. The expected result is Negative.  Fact Sheet for Patients:  BloggerCourse.com  Fact Sheet for Healthcare Providers:  SeriousBroker.it  This test is no                          t yet approved or cleared by the [place] FDA and  has been authorized for detection and/or diagnosis of SARS-CoV-2 by FDA under an Emergency Use Authorization (EUA). This EUA will remain  in effect (meaning this test can be used) for the  duration of the COVID-19 declaration under Section 564(b)(1) of the Act, 21 U.S.C.section 360bbb-3(b)(1), unless the authorization is terminated  or revoked sooner.      . Influenza A by PCR 09/05/2020 NEGATIVE  NEGATIVE Final  . Influenza B by PCR 09/05/2020 NEGATIVE  NEGATIVE Final   Comment: (NOTE) The Xpert Xpress SARS-CoV-2/FLU/RSV plus assay is intended as an aid in the diagnosis of influenza from Nasopharyngeal swab specimens and should not be used as a sole basis for treatment. Nasal washings and aspirates are unacceptable for Xpert Xpress SARS-CoV-2/FLU/RSV testing.  Fact Sheet for Patients: BloggerCourse.com  Fact Sheet for Healthcare Providers: SeriousBroker.it  This test is not yet approved or cleared by the [place] FDA and has been authorized for detection and/or diagnosis of SARS-CoV-2 by FDA under an Emergency Use Authorization (EUA). This EUA will remain in effect (meaning this test can be used) for the duration of the COVID-19 declaration under Section 564(b)(1) of the Act, 21 U.S.C. section 360bbb-3(b)(1), unless the authorization is terminated or revoked.  Performed at [facility], [address] [place], [place] [address]   . I-stat hCG, quantitative 09/05/2020 <5.0  <5 mIU/mL Final  . Comment 3 09/05/2020          Final   Comment:   GEST. AGE      CONC.  (mIU/mL)   <=1 WEEK        5 - 50     2 WEEKS       50 - 500     3 WEEKS       100 - 10,000     4 WEEKS     1,000 - 30,000        FEMALE AND NON-PREGNANT FEMALE:     LESS THAN 5  mIU/mL   . Acetaminophen (Tylenol), Serum 09/05/2020 <10* 10 - 30 ug/mL Final   Comment: (NOTE) Therapeutic concentrations vary significantly. A range of 10-30 ug/mL  may be an effective concentration for many patients. However, some  are best treated at concentrations outside of this range. Acetaminophen concentrations >150 ug/mL at 4 hours after  ingestion  and >50 ug/mL at 12 hours after ingestion are often associated with  toxic reactions.  Performed at [facility], [address] [place], [place] [address]   . Salicylate Lvl 09/05/2020 <7.0* 7.0 - 30.0 mg/dL Final   Performed at [facility], [address] [place], [place] [address]  . Opiates 09/05/2020 NONE DETECTED  NONE DETECTED Final  . Cocaine 09/05/2020 NONE DETECTED  NONE DETECTED Final  . Benzodiazepines 09/05/2020 NONE DETECTED  NONE DETECTED Final  . Amphetamines 09/05/2020 NONE DETECTED  NONE DETECTED Final  . Tetrahydrocannabinol 09/05/2020 POSITIVE* NONE DETECTED Final  . Barbiturates 09/05/2020 NONE DETECTED  NONE DETECTED Final   Comment: (NOTE) DRUG SCREEN FOR MEDICAL PURPOSES ONLY.  IF CONFIRMATION IS NEEDED FOR ANY PURPOSE, NOTIFY LAB WITHIN 5 DAYS.  LOWEST DETECTABLE LIMITS FOR URINE DRUG SCREEN Drug Class                     Cutoff (ng/mL) Amphetamine and metabolites    1000 Barbiturate and metabolites    200 Benzodiazepine                 200 Tricyclics and metabolites     300 Opiates and metabolites        300 Cocaine and metabolites        300 THC                            50 Performed at [facility], [address] [place], [place] [address]   . Alcohol, Ethyl (B) 09/05/2020 <10  <10 mg/dL Final   Comment: (NOTE) Lowest detectable limit for serum alcohol is 10 mg/dL.  For medical purposes only. Performed at [facility], [address] [place], [place] [address]     Allergies: Penicillins  PTA Medications: (Not in a hospital admission)   Medical Decision Making  Patient reviewed with Dr. Bronwen BettersLaubach. Discussed plan with patient to restart home medications, patient in agreement with plan.  Medications: -Wellbutrin XL 300 mg daily -BuSpar 10 mg twice daily    Recommendations  Based on my evaluation the patient does not appear to have an emergency  medical condition.   Patient was [facility] in [facility] continuous assessment area [facility] for treatment and stabilization.  Patient will be reassessed on 09/06/2020, disposition will be determined at that time.  Patrcia Dollyina L Tate, FNP 09/05/20  5:43 PM
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
## 2020-09-05 NOTE — BH Assessment (Signed)
[facility] Assessment Progress Note  Per Berneice Heinrich, NP, this voluntary pt is to be is transferred to the [facility].  Please call report to [phone].  Pt is to be transported via [employer].  EDP Alvira Monday, MD and pt's nurses, Galaxi and Abby, have been notified.  Doylene Canning, [place] Behavioral Health Coordinator [phone]

## 2020-09-05 NOTE — ED Notes (Signed)
[facility] RN to call back when she can take report

## 2020-09-06 DIAGNOSIS — F332 Major depressive disorder, recurrent severe without psychotic features: Secondary | ICD-10-CM

## 2020-09-06 MED ORDER — BUSPIRONE HCL 10 MG PO TABS: 10.0000 mg | ORAL_TABLET | Freq: Two times a day (BID) | ORAL | 0 refills | Status: DC

## 2020-09-06 MED ORDER — BUPROPION HCL ER (XL) 300 MG PO TB24: 300.0000 mg | ORAL_TABLET | Freq: Every day | ORAL | 0 refills | Status: DC

## 2020-09-06 NOTE — ED Notes (Signed)
Pt sleeping@this time. breathing even and unlabored. Will continue to monitor for safety 
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
## 2020-09-06 NOTE — ED Notes (Signed)
Pt d/c from facility. Discharge instructions provided and Pt stated understanding. Safe Transport called for services to be provided to Pt's home. Safety maintained. Pt alert, orient and ambulatory.

## 2020-09-06 NOTE — Discharge Instructions (Addendum)

## 2020-09-06 NOTE — ED Notes (Signed)
Pt given breakfast.

## 2020-09-06 NOTE — Progress Notes (Addendum)
CSW met with patient at bedside to discuss outpatient [facility] treatment options. Patient presented as cheerful and motivated to seek treatment. CSW provided patient with a few options and encouraged her to call to determine appropriate fit and to schedule appointment. Resources included in AVS.   Signed:  Durenda Hurt, MSW, [place], LCASA 09/06/2020 10:09 AM    ADDENDUM CSW followed up with patient to discuss outpatient options. Patient expressed interest in [facility] outpatient services. CSW scheduled outpatient therapy session for 16 Sep 2020 @ 1600. Patient informed of scheduled appointment and provided appointment printout.  Signed:  Durenda Hurt, MSW, LCSWA, LCASA 09/06/2020 11:00 AM

## 2020-09-16 ENCOUNTER — Telehealth (HOSPITAL_COMMUNITY): Payer: Self-pay | Admitting: Professional

## 2020-09-16 ENCOUNTER — Ambulatory Visit (HOSPITAL_COMMUNITY): Payer: BC Managed Care – PPO | Admitting: Professional

## 2020-09-16 ENCOUNTER — Other Ambulatory Visit: Payer: Self-pay

## 2020-09-16 NOTE — Telephone Encounter (Signed)
See call log 

## 2021-04-21 DIAGNOSIS — Z01419 Encounter for gynecological examination (general) (routine) without abnormal findings: Secondary | ICD-10-CM | POA: Diagnosis not present

## 2021-04-21 DIAGNOSIS — Z113 Encounter for screening for infections with a predominantly sexual mode of transmission: Secondary | ICD-10-CM | POA: Diagnosis not present

## 2021-09-10 ENCOUNTER — Other Ambulatory Visit: Payer: Self-pay

## 2021-09-10 ENCOUNTER — Ambulatory Visit (HOSPITAL_COMMUNITY)
Admission: EM | Admit: 2021-09-10 | Discharge: 2021-09-10 | Disposition: A | Discharge status: Home / Self Care | Payer: BC Managed Care – PPO

## 2021-09-10 ENCOUNTER — Encounter (HOSPITAL_COMMUNITY): Payer: Self-pay | Admitting: Emergency Medicine

## 2021-09-10 DIAGNOSIS — J069 Acute upper respiratory infection, unspecified: Secondary | ICD-10-CM

## 2021-09-10 MED ORDER — IPRATROPIUM BROMIDE 0.03 % NA SOLN: 2.0000 | Freq: Two times a day (BID) | NASAL | 12 refills | Status: DC

## 2021-09-10 MED ORDER — MONTELUKAST SODIUM 10 MG PO TABS: 10.0000 mg | ORAL_TABLET | Freq: Every day | ORAL | 0 refills | Status: DC

## 2021-09-10 NOTE — ED Provider Notes (Signed)
[facility]    CSN: [id number] Arrival date & time: 09/10/21  1456      History   Chief Complaint Chief Complaint  Patient presents with   URI    HPI Lashawne L Plasencia is a 31 y.o. female.   Danielle Dess is a 31 year old female who presents today with a 3-day onset of cough and nasal congestion.  She states that Sunday night she started with a cough, and it progressed to nasal congestion and chest congestion on Monday.  She states she felt feverish, but never took her temperature and denies known fever.  History of asthma and has been taking her albuterol inhaler 3 times a day with improvement to her symptoms.  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  Patient also admits however to getting sick around the same time every year, when winter starts turning into spring.  She believes it is allergy induced.  She is taking TheraFlu without resolution to her symptoms, but denies any other antihistamine use.   URI Presenting symptoms: congestion, cough, fatigue and rhinorrhea    Past Medical History:  Diagnosis Date   Anxiety    Asthma    Chlamydia contact, treated 07/2013   treated 2014, 2015   CIN I (cervical intraepithelial neoplasia I) 2012   Depression    Polycystic ovarian disease    Seasonal allergies     Patient Active Problem List   Diagnosis Date Noted   H/O chlamydia infection 01/17/2014   MDD (major depressive disorder) 10/27/2012   CIN I (cervical intraepithelial neoplasia I)    GAD (generalized anxiety disorder) 07/23/2011    Past Surgical History:  Procedure Laterality Date   COLPOSCOPY  2012   MOUTH SURGERY  2008   WISDOM TOOTH EXTRACTION  06/2013    OB History     Gravida  0   Para      Term      Preterm      AB      Living         SAB      IAB      Ectopic      Multiple      Live Births               Home Medications    Prior to Admission medications   Medication Sig Start  Date End Date Taking? Authorizing Provider  ipratropium (ATROVENT) 0.03 % nasal spray Place 2 sprays into both nostrils every 12 (twelve) hours. 09/10/21  Yes Tenessa Marsee L, PA  lurasidone (LATUDA) 20 MG TABS tablet Take by mouth.   Yes [provider]  montelukast (SINGULAIR) 10 MG tablet Take 1 tablet (10 mg total) by mouth at bedtime. 09/10/21  Yes Niamh Rada L, PA  albuterol (PROVENTIL HFA;VENTOLIN HFA) 108 (90 Base) MCG/ACT inhaler Inhale 2 puffs into the lungs every 4 (four) hours as needed for wheezing or shortness of breath. 07/04/18   Arnaldo Natal, MD  buPROPion (WELLBUTRIN XL) 300 MG 24 hr tablet Take 1 tablet (300 mg total) by mouth daily. 09/07/20   Estella Husk, MD  busPIRone (BUSPAR) 10 MG tablet Take 1 tablet (10 mg total) by mouth 2 (two) times daily. 09/06/20   Estella Husk, MD  desogestrel-ethinyl estradiol (MIRCETTE) 0.15-0.02/0.01 MG (21/5) tablet Take 1 tablet by mouth daily.    [provider]  ELDERBERRY PO Take 1 tablet by mouth daily.    [provider]  Multiple Vitamins-Minerals (ONE-A-DAY WOMENS PO) Take 1 tablet by mouth daily.    [provider]  famotidine (PEPCID) 20 MG tablet Take 1 tablet (20 mg total) by mouth 2 (two) times daily. 09/08/17 06/23/20  Renne CriglerGeiple, Joshua, PA-C    Family History Family History  Problem Relation Age of Onset   Anxiety disorder Father    Hypertension Father    Bipolar disorder Father    Anxiety disorder Paternal Grandmother    Depression Paternal Grandmother    Diabetes Paternal Grandmother    Hypertension Mother    Endometriosis Mother    Heart disease Maternal Grandmother    Fibroids Maternal Grandmother    Osteoporosis Maternal Grandmother    Diabetes Paternal Grandfather    Hypertension Maternal Grandfather     Social History Social History   Tobacco Use   Smoking status: Former   Smokeless tobacco: Never  Building services engineerVaping Use   Vaping Use: Never used  Substance Use  Topics   Alcohol use: Yes    Alcohol/week: 5.0 standard drinks    Types: 2 Cans of beer, 3 Standard drinks or equivalent per week    Comment: Patient states she drinks 23 oz beer per week   Drug use: No     Allergies   Penicillins   Review of Systems Review of Systems  Constitutional:  Positive for fatigue.  HENT:  Positive for congestion and rhinorrhea.   Respiratory:  Positive for cough.   All other systems reviewed and are negative.   Physical Exam Triage Vital Signs ED Triage Vitals  Enc Vitals Group     BP 09/10/21 1518 (!) 156/114     Pulse Rate 09/10/21 1518 90     Resp 09/10/21 1518 18     Temp 09/10/21 1518 98.5 F (36.9 C)     Temp Source 09/10/21 1518 Oral     SpO2 --      Weight 09/10/21 1517 279 lb 15.8 oz (127 kg)     Height 09/10/21 1517 5' (1.524 m)     Head Circumference --      Peak Flow --      Pain Score 09/10/21 1517 0     Pain Loc --      Pain Edu? --      Excl. in GC? --    No data found.  Updated Vital Signs BP (!) 156/114 (BP Location: Right Arm) Comment: states BP is always elevated   Pulse 90    Temp 98.5 F (36.9 C) (Oral)    Resp 18    Ht 5' (1.524 m)    Wt 279 lb 15.8 oz (127 kg)    BMI 54.68 kg/m   Visual Acuity Right Eye Distance:   Left Eye Distance:   Bilateral Distance:    Right Eye Near:   Left Eye Near:    Bilateral Near:     Physical Exam Vitals and nursing note reviewed.  Constitutional:      General: She is not in acute distress.    Appearance: Normal appearance. She is well-developed. She is obese. She is not ill-appearing, toxic-appearing or diaphoretic.  HENT:     Head: Normocephalic and atraumatic.     Right Ear: Tympanic membrane, ear canal and external ear normal. There is no impacted cerumen.     Left Ear: Tympanic membrane, ear canal and external ear normal. There is no impacted cerumen.     Nose: Congestion and rhinorrhea present.     Mouth/Throat:  Mouth: Mucous membranes are moist.     Pharynx:  No oropharyngeal exudate or posterior oropharyngeal erythema.  Eyes:     General: No scleral icterus.       Right eye: No discharge.        Left eye: No discharge.     Extraocular Movements: Extraocular movements intact.     Conjunctiva/sclera: Conjunctivae normal.     Pupils: Pupils are equal, round, and reactive to light.  Cardiovascular:     Rate and Rhythm: Normal rate and regular rhythm.     Pulses: Normal pulses.     Heart sounds: Normal heart sounds. No murmur heard.   No gallop.  Pulmonary:     Effort: Pulmonary effort is normal. No respiratory distress.     Breath sounds: Normal breath sounds. No stridor. No wheezing (MINIMAL), rhonchi or rales.  Chest:     Chest wall: No tenderness.  Abdominal:     Palpations: Abdomen is soft.     Tenderness: There is no abdominal tenderness.  Musculoskeletal:        General: No swelling or tenderness. Normal range of motion.     Cervical back: Normal range of motion and neck supple. No rigidity or tenderness.     Right lower leg: No edema.     Left lower leg: No edema.  Lymphadenopathy:     Cervical: No cervical adenopathy.  Skin:    General: Skin is warm and dry.     Capillary Refill: Capillary refill takes less than 2 seconds.  Neurological:     General: No focal deficit present.     Mental Status: She is alert and oriented to person, place, and time.  Psychiatric:        Mood and Affect: Mood normal.     UC Treatments / Results  Labs (all labs ordered are listed, but only abnormal results are displayed) Labs Reviewed - No data to display  EKG   Radiology No results found.  Procedures Procedures (including critical care time)  Medications Ordered in UC Medications - No data to display  Initial Impression / Assessment and Plan / UC Course  I have reviewed the triage vital signs and the nursing notes.  Pertinent labs & imaging results that were available during my care of the patient were reviewed by me and  considered in my medical decision making (see chart for details).  Clinical Course as of 09/10/21 1556  Wed Sep 10, 2021  1553 Recheck BP 126/74 [WC]    Clinical Course User Index [WC] Verlon Setting, Lancer Thurner L, PA    Viral vs allergic URI - stop taking theraflu. BP recheck WNL. Will start montelukast, pt has hx of use and reports tolerance and effectiveness in the past. Continue PRN inhaler use. Will also recommend daily cetirizine and saline washes/ atrovent nasal.  Supportive measures. F/U with PCP should symptoms persist.   Final Clinical Impressions(s) / UC Diagnoses   Final diagnoses:  Viral URI with cough     Discharge Instructions      Your symptoms are consistent with a viral upper respiratory infection. Please start taking montelukast nightly. You may purchase cetirizine over-the-counter for daily use.  You would take 10 mg. Please start using the Atrovent nasal spray to help dry out your sinus passages. Follow-up with your primary care provider should any symptoms persist or worsen.     ED Prescriptions     Medication Sig Dispense Auth. Provider   montelukast (SINGULAIR) 10 MG tablet Take  1 tablet (10 mg total) by mouth at bedtime. 30 tablet Trey Bebee L, PA   ipratropium (ATROVENT) 0.03 % nasal spray Place 2 sprays into both nostrils every 12 (twelve) hours. 30 mL Shalva Rozycki L, PA      PDMP not reviewed this encounter.   Maretta Bees, [place] 09/10/21 1558

## 2021-09-10 NOTE — ED Triage Notes (Signed)
Pt reports nasal congestion, fever, fatigue and cough x 4 days.

## 2021-09-10 NOTE — Discharge Instructions (Signed)
Your symptoms are consistent with a viral upper respiratory infection. Please start taking montelukast nightly. You may purchase cetirizine over-the-counter for daily use.  You would take 10 mg. Please start using the Atrovent nasal spray to help dry out your sinus passages. Follow-up with your primary care provider should any symptoms persist or worsen.

## 2021-09-16 ENCOUNTER — Ambulatory Visit (HOSPITAL_COMMUNITY)
Admission: EM | Admit: 2021-09-16 | Discharge: 2021-09-16 | Disposition: A | Discharge status: Home / Self Care | Payer: BC Managed Care – PPO | Attending: Emergency Medicine | Admitting: Emergency Medicine

## 2021-09-16 ENCOUNTER — Other Ambulatory Visit: Payer: Self-pay

## 2021-09-16 ENCOUNTER — Encounter (HOSPITAL_COMMUNITY): Payer: Self-pay

## 2021-09-16 DIAGNOSIS — J4541 Moderate persistent asthma with (acute) exacerbation: Secondary | ICD-10-CM

## 2021-09-16 DIAGNOSIS — J40 Bronchitis, not specified as acute or chronic: Secondary | ICD-10-CM | POA: Diagnosis not present

## 2021-09-16 MED ORDER — PREDNISONE 10 MG PO TABS: 20.0000 mg | ORAL_TABLET | Freq: Every day | ORAL | 0 refills | Status: AC

## 2021-09-16 MED ORDER — DOXYCYCLINE HYCLATE 100 MG PO CAPS: 100.0000 mg | ORAL_CAPSULE | Freq: Two times a day (BID) | ORAL | 0 refills | Status: DC

## 2021-09-16 MED ORDER — IPRATROPIUM-ALBUTEROL 0.5-2.5 (3) MG/3ML IN SOLN: 3.0000 mL | RESPIRATORY_TRACT | 1 refills | Status: DC | PRN

## 2021-09-16 MED ORDER — DEXAMETHASONE SODIUM PHOSPHATE 10 MG/ML IJ SOLN
10.0000 mg | Freq: Once | INTRAMUSCULAR | Status: AC
  Administered 2021-09-16: 10 mg via INTRAMUSCULAR

## 2021-09-16 MED ORDER — DEXAMETHASONE SODIUM PHOSPHATE 10 MG/ML IJ SOLN
INTRAMUSCULAR | Status: AC
  Filled 2021-09-16: qty 1

## 2021-09-16 NOTE — ED Triage Notes (Signed)
Pt reports being dx with URI last week, she states she has a cough that has gotten worse.  She reports having some SOB.

## 2021-09-16 NOTE — ED Provider Notes (Signed)
[facility]   MRN: [id number] DOB: [birth year]  Subjective:   Yolanda Callahan is a 31 y.o. female presenting for acute exacerbation of asthma and worsening bronchitis.  Patient was seen 1 week ago for similar problem but is overall worse.  She usually needs antibiotic and more steroids to improve.  She is unsure whether or not she is having fevers.  Her biggest complaint is excessive coughing and shortness of breath.  No current facility-administered medications for this encounter.  Current Outpatient Medications:    albuterol (PROVENTIL HFA;VENTOLIN HFA) 108 (90 Base) MCG/ACT inhaler, Inhale 2 puffs into the lungs every 4 (four) hours as needed for wheezing or shortness of breath., Disp: 18 g, Rfl: 3   buPROPion (WELLBUTRIN XL) 300 MG 24 hr tablet, Take 1 tablet (300 mg total) by mouth daily., Disp: 30 tablet, Rfl: 0   busPIRone (BUSPAR) 10 MG tablet, Take 1 tablet (10 mg total) by mouth 2 (two) times daily., Disp: 60 tablet, Rfl: 0   desogestrel-ethinyl estradiol (MIRCETTE) 0.15-0.02/0.01 MG (21/5) tablet, Take 1 tablet by mouth daily., Disp: , Rfl:    ELDERBERRY PO, Take 1 tablet by mouth daily., Disp: , Rfl:    ipratropium (ATROVENT) 0.03 % nasal spray, Place 2 sprays into both nostrils every 12 (twelve) hours., Disp: 30 mL, Rfl: 12   lurasidone (LATUDA) 20 MG TABS tablet, Take by mouth., Disp: , Rfl:    montelukast (SINGULAIR) 10 MG tablet, Take 1 tablet (10 mg total) by mouth at bedtime., Disp: 30 tablet, Rfl: 0   Multiple Vitamins-Minerals (ONE-A-DAY WOMENS PO), Take 1 tablet by mouth daily., Disp: , Rfl:    Allergies  Allergen Reactions   Penicillins Hives    Past Medical History:  Diagnosis Date   Anxiety    Asthma    Chlamydia contact, treated 07/2013   treated 2014, 2015   CIN I (cervical intraepithelial neoplasia I) 2012   Depression    Polycystic ovarian disease    Seasonal allergies      Past Surgical History:  Procedure Laterality Date    COLPOSCOPY  2012   MOUTH SURGERY  2008   WISDOM TOOTH EXTRACTION  06/2013    Family History  Problem Relation Age of Onset   Anxiety disorder Father    Hypertension Father    Bipolar disorder Father    Anxiety disorder Paternal Grandmother    Depression Paternal Grandmother    Diabetes Paternal Grandmother    Hypertension Mother    Endometriosis Mother    Heart disease Maternal Grandmother    Fibroids Maternal Grandmother    Osteoporosis Maternal Grandmother    Diabetes Paternal Grandfather    Hypertension Maternal Grandfather     Social History   Tobacco Use   Smoking status: Former   Smokeless tobacco: Never  Building services engineer Use: Never used  Substance Use Topics   Alcohol use: Yes    Alcohol/week: 5.0 standard drinks    Types: 2 Cans of beer, 3 Standard drinks or equivalent per week    Comment: Patient states she drinks 23 oz beer per week   Drug use: No    Review of Systems  All other systems reviewed and are negative.   Objective:   Vitals: There were no vitals taken for this visit.  Physical Exam Vitals and nursing note reviewed.  Constitutional:      General: She is not in acute distress.    Appearance: She is well-developed.  HENT:  Head: Normocephalic and atraumatic.  Eyes:     Conjunctiva/sclera: Conjunctivae normal.  Cardiovascular:     Rate and Rhythm: Regular rhythm. Tachycardia present.     Heart sounds: No murmur heard. Pulmonary:     Effort: Pulmonary effort is normal. No respiratory distress.     Breath sounds: Examination of the right-upper field reveals rhonchi. Examination of the left-upper field reveals rhonchi. Examination of the right-lower field reveals decreased breath sounds. Examination of the left-lower field reveals decreased breath sounds. Decreased breath sounds and rhonchi present.     Comments: Patient is able to speak in full sentences no accessory muscle use.  No audible rales. Abdominal:     Palpations: Abdomen  is soft.     Tenderness: There is no abdominal tenderness.  Musculoskeletal:        General: No swelling.     Cervical back: Neck supple.  Skin:    General: Skin is warm and dry.     Capillary Refill: Capillary refill takes less than 2 seconds.  Neurological:     Mental Status: She is alert.  Psychiatric:        Mood and Affect: Mood normal.    No results found for this or any previous visit (from the past 24 hour(s)).  No results found.   No data found.    Assessment and Plan :   1. Bronchitis   2. Moderate persistent asthma with exacerbation     MDM: Yolanda Callahan is a 31 y.o. female presenting for exacerbation of bronchitis and worsening cough with thickening of her discharge from her cough.  She denies fever but is already on Singulair and allergy medications.  Patient usually does not improve without antibiotic and steroid course which were ordered today.  I discussed treatment, follow up and return instructions. Questions were answered. Patient stated understanding of instructions and is stable for discharge.  PDMP not reviewed this encounter.    Hezzie Bump, NP 09/16/21 2007

## 2021-09-16 NOTE — Discharge Instructions (Addendum)
You received an injection of Decadron today 10 mg which should help decrease the swelling in your airways.  Also take prednisone 20 mg twice a day for the next 5 days.  Take antibiotic as directed continue your other medications as previously directed.  Return to urgent care or ER if worse or new symptoms for reevaluation

## 2022-06-18 ENCOUNTER — Ambulatory Visit
Admission: RE | Admit: 2022-06-18 | Discharge: 2022-06-18 | Disposition: A | Discharge status: Home / Self Care | Payer: BC Managed Care – PPO | Source: Ambulatory Visit | Attending: Nurse Practitioner | Admitting: Nurse Practitioner

## 2022-06-18 VITALS — BP 150/99 | HR 97 | Temp 98.6°F | Resp 18

## 2022-06-18 DIAGNOSIS — Z20828 Contact with and (suspected) exposure to other viral communicable diseases: Secondary | ICD-10-CM | POA: Diagnosis not present

## 2022-06-18 DIAGNOSIS — J4520 Mild intermittent asthma, uncomplicated: Secondary | ICD-10-CM

## 2022-06-18 DIAGNOSIS — R6889 Other general symptoms and signs: Secondary | ICD-10-CM

## 2022-06-18 MED ORDER — BENZONATATE 200 MG PO CAPS: 200.0000 mg | ORAL_CAPSULE | Freq: Three times a day (TID) | ORAL | 0 refills | Status: DC | PRN

## 2022-06-18 MED ORDER — OSELTAMIVIR PHOSPHATE 75 MG PO CAPS: 75.0000 mg | ORAL_CAPSULE | Freq: Two times a day (BID) | ORAL | 0 refills | Status: AC

## 2022-06-18 NOTE — ED Triage Notes (Signed)
Pt c/o cough, congestion, chills, and wheezing. States had to use her inhaler today. States took mucinex with little relief. States had positive flu exposure at work.

## 2022-06-18 NOTE — ED Provider Notes (Signed)
[facility]    CSN: [id number] Arrival date & time: 06/18/22  1823      History   Chief Complaint Chief Complaint  Patient presents with   Influenza    Came into contact with several people at work who have the flu - Entered by patient   Cough    HPI Yolanda Callahan is a 31 y.o. female presents for evaluation of flulike symptoms.  Patient reports yesterday she had onset of cough with congestion, sore throat, chills.  Denies any known fevers, nausea/vomiting/diarrhea.  She does have a history of asthma and did have some wheezing today that did resolve with her rescue inhaler.  She is vaccinated for COVID, not vaccinated for flu this season.  She reports several coworkers have tested positive with the flu and she has had close contact with them recently.  She has used over-the-counter cough medicine since onset.  No other concerns at this time.   Influenza Presenting symptoms: cough and sore throat   Associated symptoms: chills and nasal congestion   Cough Associated symptoms: chills, sore throat and wheezing     Past Medical History:  Diagnosis Date   Anxiety    Asthma    Chlamydia contact, treated 07/2013   treated 2014, 2015   CIN I (cervical intraepithelial neoplasia I) 2012   Depression    Polycystic ovarian disease    Seasonal allergies     Patient Active Problem List   Diagnosis Date Noted   H/O chlamydia infection 01/17/2014   MDD (major depressive disorder) 10/27/2012   CIN I (cervical intraepithelial neoplasia I)    GAD (generalized anxiety disorder) 07/23/2011    Past Surgical History:  Procedure Laterality Date   COLPOSCOPY  2012   MOUTH SURGERY  2008   WISDOM TOOTH EXTRACTION  06/2013    OB History     Gravida  0   Para      Term      Preterm      AB      Living         SAB      IAB      Ectopic      Multiple      Live Births               Home Medications    Prior to Admission medications   Medication  Sig Start Date End Date Taking? Authorizing Provider  benzonatate (TESSALON) 200 MG capsule Take 1 capsule (200 mg total) by mouth 3 (three) times daily as needed for cough. 06/18/22  Yes Radford Pax, NP  oseltamivir (TAMIFLU) 75 MG capsule Take 1 capsule (75 mg total) by mouth every 12 (twelve) hours for 5 days. 06/18/22 06/23/22 Yes Radford Pax, NP  albuterol (PROVENTIL HFA;VENTOLIN HFA) 108 (90 Base) MCG/ACT inhaler Inhale 2 puffs into the lungs every 4 (four) hours as needed for wheezing or shortness of breath. 07/04/18   Arnaldo Natal, MD  buPROPion (WELLBUTRIN XL) 300 MG 24 hr tablet Take 1 tablet (300 mg total) by mouth daily. 09/07/20   Estella Husk, MD  busPIRone (BUSPAR) 10 MG tablet Take 1 tablet (10 mg total) by mouth 2 (two) times daily. 09/06/20   Estella Husk, MD  desogestrel-ethinyl estradiol (MIRCETTE) 0.15-0.02/0.01 MG (21/5) tablet Take 1 tablet by mouth daily.    [provider]  doxycycline (VIBRAMYCIN) 100 MG capsule Take 1 capsule (100 mg total) by mouth 2 (two) times daily.  09/16/21   Jone Baseman, NP  ELDERBERRY PO Take 1 tablet by mouth daily.    [provider]  ipratropium (ATROVENT) 0.03 % nasal spray Place 2 sprays into both nostrils every 12 (twelve) hours. 09/10/21   Crain, Whitney L, PA  ipratropium-albuterol (DUONEB) 0.5-2.5 (3) MG/3ML SOLN Take 3 mLs by nebulization every 4 (four) hours as needed. 09/16/21   Jone Baseman, NP  lurasidone (LATUDA) 20 MG TABS tablet Take by mouth.    [provider]  montelukast (SINGULAIR) 10 MG tablet Take 1 tablet (10 mg total) by mouth at bedtime. 09/10/21   Crain, Whitney L, PA  Multiple Vitamins-Minerals (ONE-A-DAY WOMENS PO) Take 1 tablet by mouth daily.    [provider]  famotidine (PEPCID) 20 MG tablet Take 1 tablet (20 mg total) by mouth 2 (two) times daily. 09/08/17 06/23/20  Renne Crigler, PA-C    Family History Family History  Problem Relation Age of Onset    Anxiety disorder Father    Hypertension Father    Bipolar disorder Father    Anxiety disorder Paternal Grandmother    Depression Paternal Grandmother    Diabetes Paternal Grandmother    Hypertension Mother    Endometriosis Mother    Heart disease Maternal Grandmother    Fibroids Maternal Grandmother    Osteoporosis Maternal Grandmother    Diabetes Paternal Grandfather    Hypertension Maternal Grandfather     Social History Social History   Tobacco Use   Smoking status: Former   Smokeless tobacco: Never  Building services engineer Use: Never used  Substance Use Topics   Alcohol use: Yes    Alcohol/week: 5.0 standard drinks of alcohol    Types: 2 Cans of beer, 3 Standard drinks or equivalent per week    Comment: Patient states she drinks 23 oz beer per week   Drug use: No     Allergies   Penicillins   Review of Systems Review of Systems  Constitutional:  Positive for chills.  HENT:  Positive for congestion and sore throat.   Respiratory:  Positive for cough and wheezing.      Physical Exam Triage Vital Signs ED Triage Vitals  Enc Vitals Group     BP 06/18/22 1831 (!) 150/99     Pulse Rate 06/18/22 1831 97     Resp 06/18/22 1831 18     Temp 06/18/22 1831 98.6 F (37 C)     Temp Source 06/18/22 1831 Oral     SpO2 06/18/22 1831 97 %     Weight --      Height --      Head Circumference --      Peak Flow --      Pain Score 06/18/22 1832 4     Pain Loc --      Pain Edu? --      Excl. in GC? --    No data found.  Updated Vital Signs BP (!) 150/99 (BP Location: Left Arm)   Pulse 97   Temp 98.6 F (37 C) (Oral)   Resp 18   SpO2 97%   Visual Acuity Right Eye Distance:   Left Eye Distance:   Bilateral Distance:    Right Eye Near:   Left Eye Near:    Bilateral Near:     Physical Exam Vitals and nursing note reviewed.  Constitutional:      General: She is not in acute distress.    Appearance: She is well-developed.  She is not ill-appearing.  HENT:      Head: Normocephalic and atraumatic.     Right Ear: Tympanic membrane and ear canal normal.     Left Ear: Tympanic membrane and ear canal normal.     Nose: Congestion present.     Mouth/Throat:     Mouth: Mucous membranes are moist.     Pharynx: Oropharynx is clear. Uvula midline. Posterior oropharyngeal erythema present.     Tonsils: No tonsillar exudate or tonsillar abscesses.  Eyes:     Conjunctiva/sclera: Conjunctivae normal.     Pupils: Pupils are equal, round, and reactive to light.  Cardiovascular:     Rate and Rhythm: Normal rate and regular rhythm.     Heart sounds: Normal heart sounds.  Pulmonary:     Effort: Pulmonary effort is normal.     Breath sounds: Normal breath sounds. No wheezing.  Musculoskeletal:     Cervical back: Normal range of motion and neck supple.  Lymphadenopathy:     Cervical: No cervical adenopathy.  Skin:    General: Skin is warm and dry.  Neurological:     General: No focal deficit present.     Mental Status: She is alert and oriented to person, place, and time.  Psychiatric:        Mood and Affect: Mood normal.        Behavior: Behavior normal.      UC Treatments / Results  Labs (all labs ordered are listed, but only abnormal results are displayed) Labs Reviewed - No data to display  EKG   Radiology No results found.  Procedures Procedures (including critical care time)  Medications Ordered in UC Medications - No data to display  Initial Impression / Assessment and Plan / UC Course  I have reviewed the triage vital signs and the nursing notes.  Pertinent labs & imaging results that were available during my care of the patient were reviewed by me and considered in my medical decision making (see chart for details).     Reviewed exam and symptoms with patient.  Given close exposure to flu with symptoms and asthma hx,  will start Tamiflu.  Patient declined PCR flu or COVID testing Tessalon PRN Continue albuterol inhaler as  needed Follow-up with PCP in 2 to 3 days for recheck ER precautions reviewed and patient verbalized understanding Final Clinical Impressions(s) / UC Diagnoses   Final diagnoses:  Exposure to influenza  Flu-like symptoms  Mild intermittent asthma without complication     Discharge Instructions      Rest and fluids Warm liquids and salt water gargles  Over the counter ibuprofen or tylenol as needed Tamiflu as prescribed Tessalon as needed for cough Follow up with your PCP in 2-3 days for re-check  Please go to the ER for any worsening symptoms I hope you feel better soon!      ED Prescriptions     Medication Sig Dispense Auth. Provider   oseltamivir (TAMIFLU) 75 MG capsule Take 1 capsule (75 mg total) by mouth every 12 (twelve) hours for 5 days. 10 capsule Radford Pax, NP   benzonatate (TESSALON) 200 MG capsule Take 1 capsule (200 mg total) by mouth 3 (three) times daily as needed for cough. 20 capsule Radford Pax, NP      PDMP not reviewed this encounter.   Radford Pax, NP 06/18/22 [phone]

## 2022-06-18 NOTE — Discharge Instructions (Signed)
Rest and fluids Warm liquids and salt water gargles  Over the counter ibuprofen or tylenol as needed Tamiflu as prescribed Tessalon as needed for cough Follow up with your PCP in 2-3 days for re-check  Please go to the ER for any worsening symptoms I hope you feel better soon!

## 2022-08-12 ENCOUNTER — Inpatient Hospital Stay (HOSPITAL_COMMUNITY)
Admission: AD | Admit: 2022-08-12 | Discharge: 2022-08-19 | DRG: 885 | Disposition: A | Discharge status: Home / Self Care | Payer: BC Managed Care – PPO | Source: Other Acute Inpatient Hospital | Attending: Psychiatry | Admitting: Psychiatry

## 2022-08-12 ENCOUNTER — Ambulatory Visit (HOSPITAL_COMMUNITY)
Admission: EM | Admit: 2022-08-12 | Discharge: 2022-08-12 | Disposition: A | Discharge status: Other Acute Inpatient Hospital | Payer: BC Managed Care – PPO | Attending: Registered Nurse | Admitting: Registered Nurse

## 2022-08-12 ENCOUNTER — Encounter (HOSPITAL_COMMUNITY): Payer: Self-pay | Admitting: Registered Nurse

## 2022-08-12 DIAGNOSIS — Z8262 Family history of osteoporosis: Secondary | ICD-10-CM

## 2022-08-12 DIAGNOSIS — Z5941 Food insecurity: Secondary | ICD-10-CM | POA: Diagnosis not present

## 2022-08-12 DIAGNOSIS — F909 Attention-deficit hyperactivity disorder, unspecified type: Secondary | ICD-10-CM | POA: Diagnosis present

## 2022-08-12 DIAGNOSIS — F3160 Bipolar disorder, current episode mixed, unspecified: Principal | ICD-10-CM | POA: Diagnosis present

## 2022-08-12 DIAGNOSIS — Z1152 Encounter for screening for COVID-19: Secondary | ICD-10-CM | POA: Insufficient documentation

## 2022-08-12 DIAGNOSIS — Z880 Allergy status to penicillin: Secondary | ICD-10-CM | POA: Diagnosis not present

## 2022-08-12 DIAGNOSIS — Z91148 Patient's other noncompliance with medication regimen for other reason: Secondary | ICD-10-CM | POA: Insufficient documentation

## 2022-08-12 DIAGNOSIS — J45909 Unspecified asthma, uncomplicated: Secondary | ICD-10-CM | POA: Diagnosis present

## 2022-08-12 DIAGNOSIS — Z8249 Family history of ischemic heart disease and other diseases of the circulatory system: Secondary | ICD-10-CM | POA: Diagnosis not present

## 2022-08-12 DIAGNOSIS — Z9151 Personal history of suicidal behavior: Secondary | ICD-10-CM

## 2022-08-12 DIAGNOSIS — Z833 Family history of diabetes mellitus: Secondary | ICD-10-CM

## 2022-08-12 DIAGNOSIS — G4700 Insomnia, unspecified: Secondary | ICD-10-CM | POA: Diagnosis present

## 2022-08-12 DIAGNOSIS — F411 Generalized anxiety disorder: Secondary | ICD-10-CM

## 2022-08-12 DIAGNOSIS — F1220 Cannabis dependence, uncomplicated: Secondary | ICD-10-CM | POA: Diagnosis not present

## 2022-08-12 DIAGNOSIS — Z91410 Personal history of adult physical and sexual abuse: Secondary | ICD-10-CM | POA: Diagnosis not present

## 2022-08-12 DIAGNOSIS — Z5986 Financial insecurity: Secondary | ICD-10-CM | POA: Diagnosis not present

## 2022-08-12 DIAGNOSIS — Z23 Encounter for immunization: Secondary | ICD-10-CM | POA: Diagnosis not present

## 2022-08-12 DIAGNOSIS — E282 Polycystic ovarian syndrome: Secondary | ICD-10-CM | POA: Diagnosis not present

## 2022-08-12 DIAGNOSIS — R45851 Suicidal ideations: Secondary | ICD-10-CM | POA: Insufficient documentation

## 2022-08-12 DIAGNOSIS — F332 Major depressive disorder, recurrent severe without psychotic features: Secondary | ICD-10-CM

## 2022-08-12 DIAGNOSIS — F3163 Bipolar disorder, current episode mixed, severe, without psychotic features: Principal | ICD-10-CM | POA: Diagnosis present

## 2022-08-12 DIAGNOSIS — Z87410 Personal history of cervical dysplasia: Secondary | ICD-10-CM | POA: Diagnosis not present

## 2022-08-12 DIAGNOSIS — Z91411 Personal history of adult psychological abuse: Secondary | ICD-10-CM | POA: Diagnosis not present

## 2022-08-12 DIAGNOSIS — Z87891 Personal history of nicotine dependence: Secondary | ICD-10-CM

## 2022-08-12 DIAGNOSIS — Z6841 Body Mass Index (BMI) 40.0 and over, adult: Secondary | ICD-10-CM | POA: Diagnosis not present

## 2022-08-12 DIAGNOSIS — Z8619 Personal history of other infectious and parasitic diseases: Secondary | ICD-10-CM | POA: Diagnosis not present

## 2022-08-12 DIAGNOSIS — I10 Essential (primary) hypertension: Secondary | ICD-10-CM | POA: Diagnosis present

## 2022-08-12 DIAGNOSIS — Z793 Long term (current) use of hormonal contraceptives: Secondary | ICD-10-CM

## 2022-08-12 DIAGNOSIS — Z818 Family history of other mental and behavioral disorders: Secondary | ICD-10-CM

## 2022-08-12 LAB — URINALYSIS, ROUTINE W REFLEX MICROSCOPIC
Bilirubin Urine: NEGATIVE
Glucose, UA: NEGATIVE mg/dL
Hgb urine dipstick: NEGATIVE
Ketones, ur: 20 mg/dL — AB
Nitrite: NEGATIVE
Protein, ur: 100 mg/dL — AB
Specific Gravity, Urine: 1.026 (ref 1.005–1.030)
pH: 6 (ref 5.0–8.0)

## 2022-08-12 LAB — CBC WITH DIFFERENTIAL/PLATELET
Abs Immature Granulocytes: 0.02 10*3/uL (ref 0.00–0.07)
Basophils Absolute: 0 10*3/uL (ref 0.0–0.1)
Basophils Relative: 0 %
Eosinophils Absolute: 0.1 10*3/uL (ref 0.0–0.5)
Eosinophils Relative: 1 %
HCT: 39.7 % (ref 36.0–46.0)
Hemoglobin: 12.3 g/dL (ref 12.0–15.0)
Immature Granulocytes: 0 %
Lymphocytes Relative: 32 %
Lymphs Abs: 2 10*3/uL (ref 0.7–4.0)
MCH: 25.1 pg — ABNORMAL LOW (ref 26.0–34.0)
MCHC: 31 g/dL (ref 30.0–36.0)
MCV: 80.9 fL (ref 80.0–100.0)
Monocytes Absolute: 0.6 10*3/uL (ref 0.1–1.0)
Monocytes Relative: 9 %
Neutro Abs: 3.7 10*3/uL (ref 1.7–7.7)
Neutrophils Relative %: 58 %
Platelets: 276 10*3/uL (ref 150–400)
RBC: 4.91 MIL/uL (ref 3.87–5.11)
RDW: 16.2 % — ABNORMAL HIGH (ref 11.5–15.5)
WBC: 6.4 10*3/uL (ref 4.0–10.5)
nRBC: 0 % (ref 0.0–0.2)

## 2022-08-12 LAB — COMPREHENSIVE METABOLIC PANEL
ALT: 17 U/L (ref 0–44)
AST: 25 U/L (ref 15–41)
Albumin: 3.6 g/dL (ref 3.5–5.0)
Alkaline Phosphatase: 74 U/L (ref 38–126)
Anion gap: 10 (ref 5–15)
BUN: 7 mg/dL (ref 6–20)
CO2: 23 mmol/L (ref 22–32)
Calcium: 9.3 mg/dL (ref 8.9–10.3)
Chloride: 106 mmol/L (ref 98–111)
Creatinine, Ser: 0.8 mg/dL (ref 0.44–1.00)
GFR, Estimated: >60 mL/min (ref >60)
Glucose, Bld: 78 mg/dL (ref 70–99)
Potassium: 4.1 mmol/L (ref 3.5–5.1)
Sodium: 139 mmol/L (ref 135–145)
Total Bilirubin: 0.7 mg/dL (ref 0.3–1.2)
Total Protein: 7 g/dL (ref 6.5–8.1)

## 2022-08-12 LAB — RESP PANEL BY RT-PCR (RSV, FLU A&B, COVID)  RVPGX2
Influenza A by PCR: NEGATIVE
Influenza B by PCR: NEGATIVE
Resp Syncytial Virus by PCR: NEGATIVE
SARS Coronavirus 2 by RT PCR: NEGATIVE

## 2022-08-12 LAB — HEMOGLOBIN A1C
Hgb A1c MFr Bld: 5.3 % (ref 4.8–5.6)
Mean Plasma Glucose: 105.41 mg/dL

## 2022-08-12 LAB — ETHANOL: Alcohol, Ethyl (B): <10 mg/dL (ref <10)

## 2022-08-12 LAB — POCT URINE DRUG SCREEN - MANUAL ENTRY (I-SCREEN)
POC Amphetamine UR: NOT DETECTED
POC Buprenorphine (BUP): NOT DETECTED
POC Cocaine UR: NOT DETECTED
POC Marijuana UR: POSITIVE — AB
POC Methadone UR: NOT DETECTED
POC Methamphetamine UR: NOT DETECTED
POC Morphine: NOT DETECTED
POC Oxazepam (BZO): NOT DETECTED
POC Oxycodone UR: NOT DETECTED
POC Secobarbital (BAR): NOT DETECTED

## 2022-08-12 LAB — LIPID PANEL
Cholesterol: 199 mg/dL (ref 0–200)
HDL: 52 mg/dL (ref >40)
LDL Cholesterol: 130 mg/dL — ABNORMAL HIGH (ref 0–99)
Total CHOL/HDL Ratio: 3.8 RATIO
Triglycerides: 85 mg/dL (ref <150)
VLDL: 17 mg/dL (ref 0–40)

## 2022-08-12 LAB — MAGNESIUM: Magnesium: 2.3 mg/dL (ref 1.7–2.4)

## 2022-08-12 LAB — TSH: TSH: 0.703 u[IU]/mL (ref 0.350–4.500)

## 2022-08-12 LAB — POCT PREGNANCY, URINE: Preg Test, Ur: NEGATIVE

## 2022-08-12 LAB — POC SARS CORONAVIRUS 2 AG: SARSCOV2ONAVIRUS 2 AG: NEGATIVE

## 2022-08-12 MED ORDER — MAGNESIUM HYDROXIDE 400 MG/5ML PO SUSP: 30.0000 mL | Freq: Every day | ORAL | Status: DC | PRN

## 2022-08-12 MED ORDER — ALUM & MAG HYDROXIDE-SIMETH 200-200-20 MG/5ML PO SUSP: 30.0000 mL | ORAL | Status: DC | PRN

## 2022-08-12 MED ORDER — HYDROXYZINE HCL 25 MG PO TABS: 25.0000 mg | ORAL_TABLET | Freq: Three times a day (TID) | ORAL | Status: DC | PRN

## 2022-08-12 MED ORDER — ACETAMINOPHEN 325 MG PO TABS
650.0000 mg | ORAL_TABLET | Freq: Four times a day (QID) | ORAL | Status: DC | PRN
  Administered 2022-08-17 – 2022-08-18 (×2): 650 mg via ORAL
  Filled 2022-08-12 (×2): qty 2

## 2022-08-12 MED ORDER — ACETAMINOPHEN 325 MG PO TABS: 650.0000 mg | ORAL_TABLET | Freq: Four times a day (QID) | ORAL | Status: DC | PRN

## 2022-08-12 MED ORDER — TRAZODONE HCL 50 MG PO TABS
50.0000 mg | ORAL_TABLET | Freq: Every evening | ORAL | Status: DC | PRN
  Administered 2022-08-13 – 2022-08-18 (×6): 50 mg via ORAL
  Filled 2022-08-12 (×6): qty 1

## 2022-08-12 MED ORDER — TRAZODONE HCL 50 MG PO TABS: 50.0000 mg | ORAL_TABLET | Freq: Every evening | ORAL | Status: DC | PRN

## 2022-08-12 MED ORDER — HYDROXYZINE HCL 25 MG PO TABS
25.0000 mg | ORAL_TABLET | Freq: Three times a day (TID) | ORAL | Status: DC | PRN
  Administered 2022-08-13 – 2022-08-18 (×6): 25 mg via ORAL
  Filled 2022-08-12 (×6): qty 1

## 2022-08-12 NOTE — Progress Notes (Signed)
Pt was accepted to [facility] 08/12/2022, pending labs, negative covid, EKG, and signed voluntary consent faxed to [phone]. Bed assignment: 307-1  Pt meets inpatient criteria per Earleen Newport, NP  Attending Physician will be Janine Limbo, MD  Report can be called to: Adult unit: [phone]  Pt can arrive after pending items are received; [facility] to coordinate arrival time  Care Team Notified: Earleen Newport, NP and Lynnda Shields, RN  [place], [place]  08/12/2022 2:11 PM

## 2022-08-12 NOTE — ED Provider Notes (Signed)
[facility] Continuous Assessment Admission H&P  Date: 08/12/22 Patient Name: Yolanda Callahan MRN: [id number] Chief Complaint: Depression, suicidal ideation with intent and plan  Diagnoses:  Final diagnoses:  MDD (major depressive disorder), recurrent severe, without psychosis ([place])  GAD (generalized anxiety disorder)    HPI: Yolanda Callahan 32 y.o. female patient presented to [facility] as a walk in with complaints of worsening depression and suicidal ideation  Yolanda Callahan, 32 y.o., female patient seen face to face by this provider, consulted with Dr. Hampton Abbot; and chart reviewed on 08/12/22.  On evaluation Yolanda Callahan reports she came to [facility] "because I'm having a bad episode.  I'm suicidal."  Patient states she has a plan to jump off bridge "I've been riding around to find a place but I didn't go through with it."  Patient states her stressors are "I just feel overwhelmed.  I have a lot of stress right now with work and financials."   Patient states she has had one prior suicide attempt at the age 62 and was admitted to psychiatric hospital.  States she has a history of self harm but it has been 2 years since she has cut.  States last psychiatric admission was 2 yrs ago.  Patient states she has virtual outpatient psychiatric services for medication management (unable to give name or psychiatrist or company) but states it has been 2 months since she has been seen.  States she has also been off of her medications for 2 month. 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  Patient states she has decreased appetite and has lost 2-3 pounds in the last 2 weeks.  States she is only getting 2-3 hours of sleep a night.  Patient denies homicidal ideation, psychosis, and paranoia.     During evaluation Yolanda Callahan is sitting upright in  chair with no noted distress.  She is alert/oriented x 4, calm, cooperative, attentive, and responses were relevant and appropriate to assessment questions.  She spoke in a clear tone at moderate volume, and normal pace, with good eye contact.  He mood is depressed with congruent affect.   She denies suicidal/self-harm/homicidal ideation, psychosis, and paranoia.  Objectively:  there is no evidence of psychosis/mania or delusional thinking.  She conversed coherently, with goal directed thoughts, and no distractibility, or pre-occupation.  She continues to endorse suicidal ideation with plan (jump off bridge), intent, and means to carry out.  Patient reporting she has been driving around in her car to find bridge but did not go through with it.     Total Time spent with patient: 30 minutes  Musculoskeletal  Strength & Muscle Tone: within normal limits Gait & Station: normal Patient leans: N/A  Psychiatric Specialty Exam  Presentation General Appearance:  Appropriate for Environment; Casual  Eye Contact: Good  Speech: Clear and Coherent; Normal Rate  Speech Volume: Normal  Handedness: Right   Mood and Affect  Mood: Anxious; Depressed  Affect: Congruent   Thought Process  Thought Processes: Coherent; Goal Directed  Descriptions of Associations:Intact  Orientation:Full (Time, Place and Person)  Thought Content:Logical  Diagnosis of Schizophrenia or Schizoaffective disorder in past: No data recorded  Hallucinations:Hallucinations: None  Ideas of Reference:None  Suicidal Thoughts:Suicidal Thoughts: Yes, Active SI Active Intent and/or Plan: With Intent; With Plan; With  Means to Carry Out  Homicidal Thoughts:Homicidal Thoughts: No   Sensorium  Memory: Immediate Good; Recent Good; Remote Good  Judgment: Fair  Insight: Present   Executive Functions  Concentration: Good  Attention Span: Good  Recall: Good  Fund of  Knowledge: Good  Language: Good   Psychomotor Activity  Psychomotor Activity: Psychomotor Activity: Normal   Assets  Assets: Communication Skills; Desire for Improvement; Financial Resources/Insurance; Housing; Physical Health; Social Support; Transportation   Sleep  Sleep: Sleep: Poor Number of Hours of Sleep: 3   Nutritional Assessment (For OBS and FBC admissions only) Has the patient had a weight loss or gain of 10 pounds or more in the last 3 months?: Yes Has the patient had a decrease in food intake/or appetite?: Yes Does the patient have dental problems?: No Does the patient have eating habits or behaviors that may be indicators of an eating disorder including binging or inducing vomiting?: No Has the patient recently lost weight without trying?: 1 Has the patient been eating poorly because of a decreased appetite?: 1 Malnutrition Screening Tool Score: 2    Physical Exam Vitals and nursing note reviewed.  Constitutional:      Appearance: Normal appearance.  HENT:     Head: Normocephalic.  Eyes:     Conjunctiva/sclera: Conjunctivae normal.  Cardiovascular:     Rate and Rhythm: Normal rate.  Pulmonary:     Effort: Pulmonary effort is normal.  Musculoskeletal:        General: Normal range of motion.  Neurological:     Mental Status: She is alert and oriented to person, place, and time.  Psychiatric:        Attention and Perception: Attention and perception normal. She does not perceive auditory or visual hallucinations.        Mood and Affect: Mood is anxious and depressed.        Speech: Speech normal.        Behavior: Behavior normal. Behavior is cooperative.        Thought Content: Thought content is not paranoid or delusional. Thought content includes suicidal ideation. Thought content does not include homicidal ideation. Thought content includes suicidal plan.        Cognition and Memory: Cognition normal.    Review of Systems   Psychiatric/Behavioral:  Positive for depression and suicidal ideas. Negative for hallucinations. The patient is nervous/anxious and has insomnia.   All other systems reviewed and are negative.   Blood pressure (!) 128/99, pulse (!) 106, temperature 98.8 F (37.1 C), resp. rate 20, SpO2 98 %. There is no height or weight on file to calculate BMI.  Past Psychiatric History: Bipolar disorder, MDD, GAD, self harm, suicide attempt   Is the patient at risk to self? Yes  Has the patient been a risk to self in the past 6 months? Yes .    Has the patient been a risk to self within the distant past? Yes   Is the patient a risk to others? No   Has the patient been a risk to others in the past 6 months? No   Has the patient been a risk to others within the distant past? 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  Latest known visit with results is:  Admission on 09/05/2020, Discharged on 09/05/2020  Component  Date Value Ref Range Status   WBC 09/05/2020 7.0  4.0 - 10.5 K/uL Final   RBC 09/05/2020 4.77  3.87 - 5.11 MIL/uL Final   Hemoglobin 09/05/2020 11.8 (L)  12.0 - 15.0 g/dL Final   HCT 40/98/119102/17/2022 39.1  36.0 - 46.0 % Final   MCV 09/05/2020 82.0  80.0 - 100.0 fL Final   MCH 09/05/2020 24.7 (L)  26.0 - 34.0 pg Final   MCHC 09/05/2020 30.2  30.0 - 36.0 g/dL Final   RDW 47/82/956202/17/2022 17.2 (H)  11.5 - 15.5 % Final   Platelets 09/05/2020 332  150 - 400 K/uL Final   nRBC 09/05/2020 0.0  0.0 - 0.2 % Final   Neutrophils Relative % 09/05/2020 61  % Final   Neutro Abs 09/05/2020 4.3  1.7 - 7.7 K/uL Final   Lymphocytes Relative 09/05/2020 30  % Final   Lymphs Abs 09/05/2020 2.1  0.7 - 4.0 K/uL Final   Monocytes Relative 09/05/2020 7  % Final   Monocytes Absolute 09/05/2020 0.5  0.1 - 1.0 K/uL Final   Eosinophils  Relative 09/05/2020 2  % Final   Eosinophils Absolute 09/05/2020 0.1  0.0 - 0.5 K/uL Final   Basophils Relative 09/05/2020 0  % Final   Basophils Absolute 09/05/2020 0.0  0.0 - 0.1 K/uL Final   Immature Granulocytes 09/05/2020 0  % Final   Abs Immature Granulocytes 09/05/2020 0.02  0.00 - 0.07 K/uL Final   Performed at [facility], [address] [place], [place] [address]   Sodium 09/05/2020 140  135 - 145 mmol/L Final   Potassium 09/05/2020 4.0  3.5 - 5.1 mmol/L Final   Chloride 09/05/2020 108  98 - 111 mmol/L Final   CO2 09/05/2020 22  22 - 32 mmol/L Final   Glucose, Bld 09/05/2020 86  70 - 99 mg/dL Final   Glucose reference range applies only to samples taken after fasting for at least 8 hours.   BUN 09/05/2020 15  6 - 20 mg/dL Final   Creatinine, Ser 09/05/2020 0.83  0.44 - 1.00 mg/dL Final   Calcium 57/84/696202/17/2022 9.1  8.9 - 10.3 mg/dL Final   Total Protein 95/28/413202/17/2022 7.4  6.5 - 8.1 g/dL Final   Albumin 44/01/027202/17/2022 3.7  3.5 - 5.0 g/dL Final   AST 53/66/440302/17/2022 28  15 - 41 U/L Final   ALT 09/05/2020 24  0 - 44 U/L Final   Alkaline Phosphatase 09/05/2020 77  38 - 126 U/L Final   Total Bilirubin 09/05/2020 0.6  0.3 - 1.2 mg/dL Final   GFR, Estimated 09/05/2020 >60  >60 mL/min Final   Comment: (NOTE) Calculated using the CKD-EPI Creatinine Equation (2021)    Anion gap 09/05/2020 10  5 - 15 Final   Performed at [facility], [address] [place], [place] [address]   SARS Coronavirus 2 by RT PCR 09/05/2020 NEGATIVE  NEGATIVE Final   Comment: (NOTE) SARS-CoV-2 target nucleic acids are NOT DETECTED.  The SARS-CoV-2 RNA is generally detectable in upper respiratory specimens during the acute phase of infection. The lowest concentration of SARS-CoV-2 viral copies this assay can detect is 138 copies/mL. A negative result does not preclude SARS-Cov-2 infection and should not be used as the sole basis for treatment or other patient management decisions.  A negative result may occur with  improper specimen collection/handling, submission of specimen other than nasopharyngeal swab, presence of viral mutation(s) within the areas targeted by this assay, and inadequate number of viral copies(<138  copies/mL). A negative result must be combined with clinical observations, patient history, and epidemiological information. The expected result is Negative.  Fact Sheet for Patients:  BloggerCourse.com  Fact Sheet for Healthcare Providers:  SeriousBroker.it  This test is no                          t yet approved or cleared by the [place] FDA and  has been authorized for detection and/or diagnosis of SARS-CoV-2 by FDA under an Emergency Use Authorization (EUA). This EUA will remain  in effect (meaning this test can be used) for the duration of the COVID-19 declaration under Section 564(b)(1) of the Act, 21 U.S.C.section 360bbb-3(b)(1), unless the authorization is terminated  or revoked sooner.       Influenza A by PCR 09/05/2020 NEGATIVE  NEGATIVE Final   Influenza B by PCR 09/05/2020 NEGATIVE  NEGATIVE Final   Comment: (NOTE) The Xpert Xpress SARS-CoV-2/FLU/RSV plus assay is intended as an aid in the diagnosis of influenza from Nasopharyngeal swab specimens and should not be used as a sole basis for treatment. Nasal washings and aspirates are unacceptable for Xpert Xpress SARS-CoV-2/FLU/RSV testing.  Fact Sheet for Patients: BloggerCourse.com  Fact Sheet for Healthcare Providers: SeriousBroker.it  This test is not yet approved or cleared by the [place] FDA and has been authorized for detection and/or diagnosis of SARS-CoV-2 by FDA under an Emergency Use Authorization (EUA). This EUA will remain in effect (meaning this test can be used) for the duration of the COVID-19 declaration under Section 564(b)(1) of the Act, 21  U.S.C. section 360bbb-3(b)(1), unless the authorization is terminated or revoked.  Performed at [facility], [address], [place], [place] [address]    I-stat hCG, quantitative 09/05/2020 <5.0  <5 mIU/mL Final   Comment 3 09/05/2020          Final   Comment:   GEST. AGE      CONC.  (mIU/mL)   <=1 WEEK        5 - 50     2 WEEKS       50 - 500     3 WEEKS       100 - 10,000     4 WEEKS     1,000 - 30,000        FEMALE AND NON-PREGNANT FEMALE:     LESS THAN 5 mIU/mL    Acetaminophen (Tylenol), Serum 09/05/2020 <10 (L)  10 - 30 ug/mL Final   Comment: (NOTE) Therapeutic concentrations vary significantly. A range of 10-30 ug/mL  may be an effective concentration for many patients. However, some  are best treated at concentrations outside of this range. Acetaminophen concentrations >150 ug/mL at 4 hours after ingestion  and >50 ug/mL at 12 hours after ingestion are often associated with  toxic reactions.  Performed at [facility], [address] [place], [place] [address]    Salicylate Lvl 09/05/2020 <7.0 (L)  7.0 - 30.0 mg/dL Final   Performed at [facility], [address], [place], [place] [address]   Opiates 09/05/2020 NONE DETECTED  NONE DETECTED Final   Cocaine 09/05/2020 NONE DETECTED  NONE DETECTED Final   Benzodiazepines 09/05/2020 NONE DETECTED  NONE DETECTED Final   Amphetamines 09/05/2020 NONE DETECTED  NONE DETECTED Final   Tetrahydrocannabinol 09/05/2020 POSITIVE (A)  NONE DETECTED Final   Barbiturates 09/05/2020 NONE DETECTED  NONE DETECTED Final   Comment: (NOTE) DRUG  SCREEN FOR MEDICAL PURPOSES ONLY.  IF CONFIRMATION IS NEEDED FOR ANY PURPOSE, NOTIFY LAB WITHIN 5 DAYS.  LOWEST DETECTABLE LIMITS FOR URINE DRUG SCREEN Drug Class                     Cutoff (ng/mL) Amphetamine and metabolites    1000 Barbiturate and metabolites    200 Benzodiazepine                 200 Tricyclics and metabolites      300 Opiates and metabolites        300 Cocaine and metabolites        300 THC                            50 Performed at [facility], [address], [place], [place] [address]    Alcohol, Ethyl (B) 09/05/2020 <10  <10 mg/dL Final   Comment: (NOTE) Lowest detectable limit for serum alcohol is 10 mg/dL.  For medical purposes only. Performed at [facility], [address] [place], [place] [address]     Allergies: Penicillins  Medications:  PTA Medications  Medication Sig   albuterol (PROVENTIL HFA;VENTOLIN HFA) 108 (90 Base) MCG/ACT inhaler Inhale 2 puffs into the lungs every 4 (four) hours as needed for wheezing or shortness of breath.   desogestrel-ethinyl estradiol (MIRCETTE) 0.15-0.02/0.01 MG (21/5) tablet Take 1 tablet by mouth daily.   Multiple Vitamins-Minerals (ONE-A-DAY WOMENS PO) Take 1 tablet by mouth daily.   ELDERBERRY PO Take 1 tablet by mouth daily.   buPROPion (WELLBUTRIN XL) 300 MG 24 hr tablet Take 1 tablet (300 mg total) by mouth daily.   busPIRone (BUSPAR) 10 MG tablet Take 1 tablet (10 mg total) by mouth 2 (two) times daily.   lurasidone (LATUDA) 20 MG TABS tablet Take by mouth.   montelukast (SINGULAIR) 10 MG tablet Take 1 tablet (10 mg total) by mouth at bedtime.   ipratropium (ATROVENT) 0.03 % nasal spray Place 2 sprays into both nostrils every 12 (twelve) hours.   doxycycline (VIBRAMYCIN) 100 MG capsule Take 1 capsule (100 mg total) by mouth 2 (two) times daily.   ipratropium-albuterol (DUONEB) 0.5-2.5 (3) MG/3ML SOLN Take 3 mLs by nebulization every 4 (four) hours as needed.   benzonatate (TESSALON) 200 MG capsule Take 1 capsule (200 mg total) by mouth 3 (three) times daily as needed for cough.    Medical Decision Making  Yolanda Callahan was admitted to [facility] continuous assessment unit  for MDD (major depressive disorder), recurrent severe, without psychosis (HCC), crisis  management, and stabilization while awaiting appropriate bed for inpatient psychiatric treatment. Routine labs ordered, which include  Lab Orders         Resp panel by RT-PCR (RSV, Flu A&B, Covid) Anterior Nasal Swab         CBC with Differential/Platelet         Comprehensive metabolic panel         Hemoglobin A1c         Magnesium         Ethanol         Lipid panel         TSH         Prolactin         Urinalysis, Routine w reflex microscopic -Urine, Clean Catch         POC urine  preg, ED    Medication Management: Medications started Meds ordered this encounter  Medications   acetaminophen (TYLENOL) tablet 650 mg   alum & mag hydroxide-simeth (MAALOX/MYLANTA) 200-200-20 MG/5ML suspension 30 mL   magnesium hydroxide (MILK OF MAGNESIA) suspension 30 mL   hydrOXYzine (ATARAX) tablet 25 mg   traZODone (DESYREL) tablet 50 mg    Will maintain continuous observation for safety. Social work/TOC to assist in find appropriated bed for inpatient psychiatric treatment.  Recommendations  Based on my evaluation the patient does not appear to have an emergency medical condition.  Amun Stemm, NP 08/12/22  1:54 PM

## 2022-08-12 NOTE — Progress Notes (Signed)
   08/12/22 1123  [place] (Walk-ins at [facility] only)  How Did You Hear About [place]? Self  What Is the Reason for Your Visit/Call Today? Passive SI with a plan to to jump from a bridge; Pt is a 32 year old presents to [facility] and unaccompaned.  Pt reports that she needs some one to talk to, "I want to jump".  Pt reports prior history of sucide attempt by overdose.  Pt reports prior his tory of cutting three years ago.  Pt admits to prior MH diagnosis or precribed medication for symptom managment.  MSe signed by pateint.  How Long Has This Been Causing You Problems? <Week  Have You Recently Had Any Thoughts About Hurting Yourself? Yes  How long ago did you have thoughts about hurting yourself? week  Are You Planning to [place] At This time? No  Have you Recently Had Thoughts About [place]? No  Are You Planning To Harm Someone At This Time? No  Are you currently experiencing any auditory, visual or other hallucinations? No  Have You Used Any Alcohol or Drugs in the Past 24 Hours? No  Do you have any current medical co-morbidities that require immediate attention? No  Clinician description of patient physical appearance/behavior: Cooperative  What Do You Feel Would Help You the Most Today? Treatment for Depression or other mood problem  If access to [facility] was not available, would you have sought care in the Emergency Department? Yes  Determination of Need Urgent (48 hours)  Options For Referral [facility]

## 2022-08-12 NOTE — ED Notes (Signed)
Pt laying quietly in bed watching TV.  No distress noted. Staff will continue to monitor for safety.

## 2022-08-12 NOTE — ED Notes (Signed)
Patient A&Ox4. Patient endorses SI with a plan to jump off bridge. Patient denies HI and AVH. Patient denies any physical complaints when asked. No acute distress noted. Support and encouragement provided. Routine safety checks conducted according to facility protocol. Encouraged patient to notify staff if thoughts of harm toward self or others arise. Patient verbalize understanding and agreement. Will continue to monitor for safety.

## 2022-08-12 NOTE — ED Notes (Signed)
Report called to [facility]. Pt will be transferred around 2115

## 2022-08-12 NOTE — ED Notes (Signed)
Safe transport called to transport pt to [facility]

## 2022-08-12 NOTE — ED Notes (Signed)
Vol consent signed and faxed to [facility] 
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
## 2022-08-13 ENCOUNTER — Other Ambulatory Visit: Payer: Self-pay

## 2022-08-13 ENCOUNTER — Encounter (HOSPITAL_COMMUNITY): Payer: Self-pay | Admitting: Registered Nurse

## 2022-08-13 DIAGNOSIS — F3160 Bipolar disorder, current episode mixed, unspecified: Secondary | ICD-10-CM

## 2022-08-13 DIAGNOSIS — F1220 Cannabis dependence, uncomplicated: Secondary | ICD-10-CM | POA: Diagnosis present

## 2022-08-13 DIAGNOSIS — J45909 Unspecified asthma, uncomplicated: Secondary | ICD-10-CM | POA: Diagnosis present

## 2022-08-13 DIAGNOSIS — G4700 Insomnia, unspecified: Secondary | ICD-10-CM | POA: Diagnosis present

## 2022-08-13 MED ORDER — LORAZEPAM 1 MG PO TABS: 1.0000 mg | ORAL_TABLET | ORAL | Status: DC | PRN

## 2022-08-13 MED ORDER — INFLUENZA VAC SPLIT QUAD 0.5 ML IM SUSY
0.5000 mL | PREFILLED_SYRINGE | INTRAMUSCULAR | Status: AC
  Administered 2022-08-19: 0.5 mL via INTRAMUSCULAR
  Filled 2022-08-13: qty 0.5

## 2022-08-13 MED ORDER — RISPERIDONE 2 MG PO TBDP: 2.0000 mg | ORAL_TABLET | Freq: Three times a day (TID) | ORAL | Status: DC | PRN

## 2022-08-13 MED ORDER — LURASIDONE HCL 40 MG PO TABS
40.0000 mg | ORAL_TABLET | Freq: Every day | ORAL | Status: DC
  Administered 2022-08-14 – 2022-08-15 (×2): 40 mg via ORAL
  Filled 2022-08-13 (×5): qty 1

## 2022-08-13 MED ORDER — ZIPRASIDONE MESYLATE 20 MG IM SOLR: 20.0000 mg | INTRAMUSCULAR | Status: DC | PRN

## 2022-08-13 MED ORDER — LURASIDONE HCL 20 MG PO TABS
20.0000 mg | ORAL_TABLET | Freq: Every day | ORAL | Status: AC
  Filled 2022-08-13: qty 1

## 2022-08-13 NOTE — Progress Notes (Signed)
   08/13/22 0516  15 Minute Checks  Location Bedroom  Visual Appearance Calm  Behavior Sleeping  Sleep (Behavioral Health Patients Only)  Calculate sleep? (Click Yes once per 24 hr at 0600 safety check) Yes  Documented sleep last 24 hours 5.75

## 2022-08-13 NOTE — Group Note (Signed)
[facility] LCSW Group Therapy Note   Group Date: 08/13/2022 Start Time: 1100 End Time: 1200   Type of Therapy and Topic: Group Therapy: Avoiding Self-Sabotaging and Enabling Behaviors  Participation Level: Active  Mood: pleasant   Description of Group:  In this group, patients will learn how to identify obstacles, self-sabotaging and enabling behaviors, as well as: what are they, why do we do them and what needs these behaviors meet. Discuss unhealthy relationships and how to have positive healthy boundaries with those that sabotage and enable. Explore aspects of self-sabotage and enabling in yourself and how to limit these self-destructive behaviors in everyday life.   Therapeutic Goals: 1. Patient will identify one obstacle that relates to self-sabotage and enabling behaviors 2. Patient will identify one personal self-sabotaging or enabling behavior they did prior to admission 3. Patient will state a plan to change the above identified behavior 4. Patient will demonstrate ability to communicate their needs through discussion and/or role play.    Therapeutic Modalities:  Cognitive Behavioral Therapy Person-Centered Therapy Motivational Interviewing    Aralyn Nowak S Rielly Brunn, LCSW

## 2022-08-13 NOTE — BHH Suicide Risk Assessment (Signed)
Suicide Risk Assessment  Admission Assessment    [facility] Admission Suicide Risk Assessment   Nursing information obtained from:  Patient Demographic factors:  NA Current Mental Status:  NA Loss Factors:  Loss of significant relationship, Financial problems / change in socioeconomic status Historical Factors:  Prior suicide attempts, Family history of mental illness or substance abuse Risk Reduction Factors:  Employed, Positive coping skills or problem solving skills, Living with another person, especially a relative, Sense of responsibility to family, Positive social support  Total Time spent with patient: 1.5 hours Principal Problem: Bipolar I disorder, most recent episode mixed ([place]) Diagnosis:  Principal Problem:   Bipolar I disorder, most recent episode mixed ([place]) Active Problems:   GAD (generalized anxiety disorder)   Delta-9-tetrahydrocannabinol (THC) dependence ([place])   Insomnia   Asthma  Subjective Data:  " I was having thoughts of [place] my [place] a [place]. I hit rock bottom. A lot has been going on and I had a trigger. My bank closed my checking account. I had nothing to eat. I went 12 hours with no food. I could not feed my dog. Instead of driving my [place] the [place], I went to the urgent care."  Continued Clinical Symptoms: Depressed mood, medication noncompliance prior to hospitalization, history of suicide attempts, in need of continuous hospitalization for treatment and stabilization of mental status. Alcohol Use Disorder Identification Test Final Score (AUDIT): 4 The "Alcohol Use Disorders Identification Test", Guidelines for Use in Primary Care, Second Edition.  World Pharmacologist [employer]). Score between 0-7:  no or low risk or alcohol related problems. Score between 8-15:  moderate risk of alcohol related problems. Score between 16-19:  high risk of alcohol related problems. Score 20 or above:  warrants further diagnostic evaluation for alcohol dependence and  treatment.  CLINICAL FACTORS:   Bipolar Disorder:   Mixed State  Musculoskeletal: Strength & Muscle Tone: within normal limits Gait & Station: normal Patient leans: N/A  Psychiatric Specialty Exam:  Presentation  General Appearance:  Appropriate for Environment; Fairly Groomed  Eye Contact: Good  Speech: Clear and Coherent  Speech Volume: Normal  Handedness: Right   Mood and Affect  Mood: Depressed  Affect: Congruent; Appropriate   Thought Process  Thought Processes: Coherent  Descriptions of Associations:Intact  Orientation:Full (Time, Place and Person)  Thought Content:Logical  History of Schizophrenia/Schizoaffective disorder:No  Duration of Psychotic Symptoms:No data recorded Hallucinations:Hallucinations: None  Ideas of Reference:None  Suicidal Thoughts:Suicidal Thoughts: No SI Active Intent and/or Plan: With Intent; With Plan; With Means to Carry Out  Homicidal Thoughts:Homicidal Thoughts: No   Sensorium  Memory: Immediate Good  Judgment: Fair  Insight: Fair; Good   Executive Functions  Concentration: Fair  Attention Span: Good  Recall: [place] of Knowledge: Good  Language: Good   Psychomotor Activity  Psychomotor Activity: Psychomotor Activity: Normal   Assets  Assets: Communication Skills; Housing   Sleep  Sleep: Sleep: Poor Number of Hours of Sleep: 3    Physical Exam: Physical Exam Review of Systems  Constitutional: Negative.   HENT: Negative.    Respiratory: Negative.    Cardiovascular: Negative.   Gastrointestinal: Negative.   Genitourinary: Negative.   Musculoskeletal: Negative.   Skin: Negative.   Neurological: Negative.   Psychiatric/Behavioral:  Positive for depression and substance abuse. Negative for hallucinations, memory loss and suicidal ideas. The patient is nervous/anxious and has insomnia.    Blood pressure (!) 145/87, pulse (!) 55, temperature 98.1 F (36.7 C),  temperature source Oral,  resp. rate 16, height 5' (1.524 m), weight 126.1 kg, SpO2 100 %. Body mass index is 54.29 kg/m.   COGNITIVE FEATURES THAT CONTRIBUTE TO RISK:  None    SUICIDE RISK:   Moderate:  Frequent suicidal ideation with limited intensity, and duration, some specificity in terms of plans, no associated intent, good self-control, limited dysphoria/symptomatology, some risk factors present, and identifiable protective factors, including available and accessible social support.  PLAN OF CARE: Please See H & P  I certify that inpatient services furnished can reasonably be expected to improve the patient's condition.   Nicholes Rough, NP 08/13/2022, 6:40 PM

## 2022-08-13 NOTE — H&P (Addendum)
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 Process  Thought Processes: Coherent  Duration of Psychotic Symptoms:N/A Past Diagnosis of Schizophrenia or Psychoactive disorder: No  Descriptions of Associations:Intact  Orientation:Full (Time, Place and Person)  Thought Content:Logical  Hallucinations:Hallucinations: None  Ideas of Reference:None  Suicidal Thoughts:Suicidal Thoughts: No SI Active Intent and/or Plan: With Intent; With Plan; With Means to Carry Out  Homicidal Thoughts:Homicidal Thoughts: No   Sensorium   Memory: Immediate Good  Judgment: Fair  Insight: Fair; Good   Executive Functions  Concentration: Fair  Attention Span: Good  Recall: [place] of Knowledge: Good  Language: Good   Psychomotor Activity  Psychomotor Activity: Psychomotor Activity: Normal   Assets  Assets: Communication Skills; Housing   Sleep  Sleep: Sleep: Poor Number of Hours of Sleep: 3    Physical Exam: Physical Exam Constitutional:      Appearance: Normal appearance.  Pulmonary:     Effort: Pulmonary effort is normal.     Breath sounds: Normal breath sounds.  Neurological:     Mental Status: She is oriented to person, place, and time.  Psychiatric:        Thought Content: Thought content normal.    Review of Systems  Constitutional: Negative.   HENT: Negative.    Eyes: Negative.   Respiratory: Negative.    Cardiovascular: Negative.   Gastrointestinal: Negative.   Genitourinary: Negative.   Musculoskeletal: Negative.  Skin: Negative.   Neurological: Negative.   Psychiatric/Behavioral:  Positive for depression and substance abuse. Negative for hallucinations, memory loss and suicidal ideas. The patient has insomnia.    Blood pressure (!) 145/87, pulse (!) 55, temperature 98.1 F (36.7 C), temperature source Oral, resp. rate 16, height 5' (1.524 m), weight 126.1 kg, SpO2 100 %. Body mass index is 54.29 kg/m.  Treatment Plan Summary: Daily contact with patient to assess and evaluate symptoms and progress in treatment and Medication management  Observation Level/Precautions:  15 minute checks  Laboratory:  Labs reviewed   Psychotherapy:  Unit Group sessions  Medications:  See [facility]  Consultations:  To be determined   Discharge Concerns:  Safety, medication compliance, mood stability  Estimated LOS: 5-7 days  Other:  N/A  Labs reviewed independently on 08/13/2022: Urine pregnancy test negative, UA with small leukocytes, few bacteria, patient is asymptomatic but will  repeat.  TSH WNL.  Lipid panel W and L with exception of slightly elevated LDL at 130.  Hemoglobin A1c WNL.  CMP WNL.  CBC mostly WNL.  BAL less than 10.  EKG with QTc 432.  PLAN Safety and Monitoring: Voluntary admission to inpatient psychiatric unit for safety, stabilization and treatment Daily contact with patient to assess and evaluate symptoms and progress in treatment Patient's case to be discussed in multi-disciplinary team meeting Observation Level : q15 minute checks Vital signs: q12 hours Precautions: Safety  Long Term Goal(s): Improvement in symptoms so as ready for discharge  Short Term Goals: Ability to identify changes in lifestyle to reduce recurrence of condition will improve, Ability to verbalize feelings will improve, Ability to disclose and discuss suicidal ideas, Ability to demonstrate self-control will improve, Ability to identify and develop effective coping behaviors will improve, Compliance with prescribed medications will improve, and Ability to identify triggers associated with substance abuse/mental health issues will improve  Diagnoses:  Principal Problem:   Bipolar I disorder, most recent episode mixed (HCC) Active Problems:   GAD (generalized anxiety disorder)   Delta-9-tetrahydrocannabinol (THC) dependence (HCC)   Insomnia   Asthma  Medications -Start Latuda 20 mg today (08/13/2022) for mood stabilization -Increase Latuda to 40 mg on 08/14/2022 for mood stabilization -Start trazodone 50 mg nightly as needed for sleep -Continue hydroxyzine 25 mg 3 times daily as needed for anxiety -Start agitation protocol: Risperdal/Ativan/Geodon as needed-please see MAR for details  Other PRNS -Continue Tylenol 650 mg every 6 hours PRN for mild pain -Continue Maalox 30 mg every 4 hrs PRN for indigestion -Continue Milk of Magnesia as needed every 6 hrs for constipation  Discharge Planning: Social work and case management to assist with discharge planning and  identification of hospital follow-up needs prior to discharge Estimated LOS: 5-7 days Discharge Concerns: Need to establish a safety plan; Medication compliance and effectiveness Discharge Goals: Return home with outpatient referrals for mental health follow-up including medication management/psychotherapy  I certify that inpatient services furnished can reasonably be expected to improve the patient's condition.    Starleen Blue, NP 1/25/20246:36 PM

## 2022-08-13 NOTE — Progress Notes (Signed)
D: Patient is alert, anxious, pleasant, and cooperative. Denies SI, HI, AVH, and verbally contracts for safety. Patient denies physical symptoms/pain.   A: Scheduled medications administered per MD order. Support provided. Patient educated on safety on the unit. Routine safety checks every 15 minutes. Patient stated understanding to tell nurse about any new physical symptoms. Patient understands to tell staff of any needs.     R: Patient verbally contracts for safety. Patient remains safe at this time and will continue to monitor.    08/13/22 0900  Psych Admission Type (Psych Patients Only)  Admission Status Voluntary  Psychosocial Assessment  Patient Complaints Anxiety  Eye Contact Fair  Facial Expression Anxious  Affect Anxious  Speech Logical/coherent  Interaction Assertive  Motor Activity Other (Comment) (WDL)  Appearance/Hygiene Unremarkable  Behavior Characteristics Anxious;Cooperative  Mood Anxious  Thought Process  Coherency WDL  Content WDL  Delusions None reported or observed  Perception WDL  Hallucination None reported or observed  Judgment Impaired  Confusion None  Danger to Self  Current suicidal ideation? Denies  Self-Injurious Behavior No self-injurious ideation or behavior indicators observed or expressed   Agreement Not to Harm Self Yes  Description of Agreement verbal  Danger to Others  Danger to Others None reported or observed

## 2022-08-13 NOTE — Tx Team (Signed)
Initial Treatment Plan 08/13/2022 12:42 AM Yolanda Callahan QQV:[id number]    PATIENT STRESSORS: Financial difficulties   Marital or family conflict   Occupational concerns     PATIENT STRENGTHS: Ability for insight  Average or above average intelligence  Capable of independent living  Marketing executive fund of knowledge  Motivation for treatment/growth  Physical Health  Supportive family/friends    PATIENT IDENTIFIED PROBLEMS: Financial Concerns  Relationship issue  Work issues  "Engineer, petroleum"  "Help with medication management"             DISCHARGE CRITERIA:  Improved stabilization in mood, thinking, and/or behavior Reduction of life-threatening or endangering symptoms to within safe limits Verbal commitment to aftercare and medication compliance  PRELIMINARY DISCHARGE PLAN: Outpatient therapy Return to previous living arrangement Return to previous work or school arrangements  PATIENT/FAMILY INVOLVEMENT: This treatment plan has been presented to and reviewed with the patient, [name].  The patient has been given the opportunity to ask questions and make suggestions.  Lance Bosch, RN 08/13/2022, 12:42 AM

## 2022-08-13 NOTE — Progress Notes (Signed)
Patient ID: Yolanda Callahan, female   DOB: 1991-06-04, 32 y.o.   MRN: [id number]  Patient admitted from [facility] voluntarily for worsening depression and SI. Patient currently denies SI/HI/AVH and pain. Patient reports hopelessness, depression, and anxiety. Reports an average of 2-3 hours of sleep recently. 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 Patient lives in her own apartment with her 88 year old brother. Patient reports that she has a positive support system that includes her parents, brother, and friends. Patient reports that her worsening depression is the result of recent financial stress, work issue, and the loss of a relationship. Patient pleasant during interview with moments of tearfulness. Patient reports that at home medications include Latuda, Wellbutrin, Buspar, and birth control pills. Patient is allergic to penicillin. Patient skin assessed and no contraband found. Patient oriented to the unit rules and procedures. Patient verbalized understanding. Patient offered food and drink, but declined. Safety checks implemented. Patient remains safe at this time.

## 2022-08-13 NOTE — Progress Notes (Signed)
D) Pt received calm, visible, participating in milieu, and in no acute distress. Pt A & O x4. Pt denies SI, HI, A/ V H, depression, anxiety and pain at this time. A) Pt encouraged to drink fluids. Pt encouraged to come to staff with needs. Pt encouraged to attend and participate in groups. Pt encouraged to set reachable goals.  R) Pt remained safe on unit, in no acute distress, will continue to assess.     08/13/22 2100  Psych Admission Type (Psych Patients Only)  Admission Status Voluntary  Psychosocial Assessment  Patient Complaints Anxiety  Eye Contact Fair  Facial Expression Anxious  Affect Anxious  Speech Logical/coherent  Interaction Assertive  Motor Activity Other (Comment) (wnl)  Appearance/Hygiene Unremarkable  Behavior Characteristics Anxious;Cooperative  Mood Anxious  Thought Process  Coherency WDL  Content WDL  Delusions None reported or observed  Perception WDL  Hallucination None reported or observed  Judgment Impaired  Confusion None  Danger to Self  Current suicidal ideation? Denies  Self-Injurious Behavior No self-injurious ideation or behavior indicators observed or expressed   Agreement Not to Harm Self Yes  Description of Agreement verbal  Danger to Others  Danger to Others None reported or observed

## 2022-08-14 ENCOUNTER — Encounter (HOSPITAL_COMMUNITY): Payer: Self-pay

## 2022-08-14 DIAGNOSIS — F3160 Bipolar disorder, current episode mixed, unspecified: Secondary | ICD-10-CM | POA: Diagnosis not present

## 2022-08-14 LAB — PROLACTIN: Prolactin: 12.8 ng/mL (ref 4.8–33.4)

## 2022-08-14 MED ORDER — LURASIDONE HCL 40 MG PO TABS
40.0000 mg | ORAL_TABLET | Freq: Every day | ORAL | Status: DC
  Administered 2022-08-15 – 2022-08-16 (×2): 40 mg via ORAL
  Filled 2022-08-14 (×5): qty 1

## 2022-08-14 MED ORDER — LURASIDONE HCL 20 MG PO TABS: 20.0000 mg | ORAL_TABLET | Freq: Every day | ORAL | Status: DC

## 2022-08-14 NOTE — BH IP Treatment Plan (Signed)
Interdisciplinary Treatment and Diagnostic Plan Update  08/14/2022 Time of Session: 10:10 AM  Yolanda Callahan MRN: [id number]  Principal Diagnosis: Bipolar I disorder, most recent episode mixed ([place])  Secondary Diagnoses: Principal Problem:   Bipolar I disorder, most recent episode mixed ([place]) Active Problems:   GAD (generalized anxiety disorder)   Delta-9-tetrahydrocannabinol (THC) dependence ([place])   Insomnia   Asthma   Current Medications:  Current Facility-Administered Medications  Medication Dose Route Frequency Provider Last Rate Last Admin   acetaminophen (TYLENOL) tablet 650 mg  650 mg Oral Q6H PRN Rankin, Shuvon B, NP       alum & mag hydroxide-simeth (MAALOX/MYLANTA) 200-200-20 MG/5ML suspension 30 mL  30 mL Oral Q4H PRN Rankin, Shuvon B, NP       hydrOXYzine (ATARAX) tablet 25 mg  25 mg Oral TID PRN Rankin, Shuvon B, NP   25 mg at 08/13/22 2119   influenza vac split quadrivalent PF (FLUARIX) injection 0.5 mL  0.5 mL Intramuscular Tomorrow-1000 Massengill, Ovid Curd, MD       risperiDONE (RISPERDAL M-TABS) disintegrating tablet 2 mg  2 mg Oral Q8H PRN Massengill, Nathan, MD       And   LORazepam (ATIVAN) tablet 1 mg  1 mg Oral PRN Massengill, Ovid Curd, MD       And   ziprasidone (GEODON) injection 20 mg  20 mg Intramuscular PRN Massengill, Ovid Curd, MD       lurasidone (LATUDA) tablet 20 mg  20 mg Oral Q supper Massengill, Nathan, MD       Followed by   lurasidone (LATUDA) tablet 40 mg  40 mg Oral Q supper Massengill, Nathan, MD       magnesium hydroxide (MILK OF MAGNESIA) suspension 30 mL  30 mL Oral Daily PRN Rankin, Shuvon B, NP       traZODone (DESYREL) tablet 50 mg  50 mg Oral QHS PRN Rankin, Shuvon B, NP   50 mg at 08/13/22 2119   PTA Medications: Medications Prior to Admission  Medication Sig Dispense Refill Last Dose   acetaminophen (TYLENOL) 500 MG tablet Take 1,000 mg by mouth every 6 (six) hours as needed for headache.      albuterol (PROVENTIL HFA;VENTOLIN HFA)  108 (90 Base) MCG/ACT inhaler Inhale 2 puffs into the lungs every 4 (four) hours as needed for wheezing or shortness of breath. 18 g 3    buPROPion (WELLBUTRIN XL) 300 MG 24 hr tablet Take 1 tablet (300 mg total) by mouth daily. 30 tablet 0    busPIRone (BUSPAR) 10 MG tablet Take 1 tablet (10 mg total) by mouth 2 (two) times daily. 60 tablet 0    desogestrel-ethinyl estradiol (KARIVA) 0.15-0.02/0.01 MG (21/5) tablet Take 1 tablet by mouth daily.      lurasidone (LATUDA) 20 MG TABS tablet Take 20 mg by mouth daily.      Multiple Vitamin (MULTIVITAMIN WITH MINERALS) TABS tablet Take 1 tablet by mouth daily.       Patient Stressors: Financial difficulties   Marital or family conflict   Occupational concerns    Patient Strengths: Ability for insight  Average or above average intelligence  Capable of independent living  Marketing executive fund of knowledge  Motivation for treatment/growth  Physical Health  Supportive family/friends   Treatment Modalities: Medication Management, Group therapy, Case management,  1 to 1 session with clinician, Psychoeducation, Recreational therapy.   Physician Treatment Plan for Primary Diagnosis: Bipolar I disorder, most recent episode mixed ([place]) Long Term Goal(s): Improvement  in symptoms so as ready for discharge   Short Term Goals: Ability to identify changes in lifestyle to reduce recurrence of condition will improve Ability to verbalize feelings will improve Ability to disclose and discuss suicidal ideas Ability to demonstrate self-control will improve Ability to identify and develop effective coping behaviors will improve Compliance with prescribed medications will improve Ability to identify triggers associated with substance abuse/mental health issues will improve  Medication Management: Evaluate patient's response, side effects, and tolerance of medication regimen.  Therapeutic Interventions: 1 to 1 sessions, Unit Group sessions and  Medication administration.  Evaluation of Outcomes: Progressing  Physician Treatment Plan for Secondary Diagnosis: Principal Problem:   Bipolar I disorder, most recent episode mixed (HCC) Active Problems:   GAD (generalized anxiety disorder)   Delta-9-tetrahydrocannabinol (THC) dependence (HCC)   Insomnia   Asthma  Long Term Goal(s): Improvement in symptoms so as ready for discharge   Short Term Goals: Ability to identify changes in lifestyle to reduce recurrence of condition will improve Ability to verbalize feelings will improve Ability to disclose and discuss suicidal ideas Ability to demonstrate self-control will improve Ability to identify and develop effective coping behaviors will improve Compliance with prescribed medications will improve Ability to identify triggers associated with substance abuse/mental health issues will improve     Medication Management: Evaluate patient's response, side effects, and tolerance of medication regimen.  Therapeutic Interventions: 1 to 1 sessions, Unit Group sessions and Medication administration.  Evaluation of Outcomes: Progressing   RN Treatment Plan for Primary Diagnosis: Bipolar I disorder, most recent episode mixed (HCC) Long Term Goal(s): Knowledge of disease and therapeutic regimen to maintain health will improve  Short Term Goals: Ability to remain free from injury will improve, Ability to verbalize frustration and anger appropriately will improve, Ability to demonstrate self-control, Ability to participate in decision making will improve, Ability to verbalize feelings will improve, Ability to disclose and discuss suicidal ideas, Ability to identify and develop effective coping behaviors will improve, and Compliance with prescribed medications will improve  Medication Management: RN will administer medications as ordered by provider, will assess and evaluate patient's response and provide education to patient for prescribed  medication. RN will report any adverse and/or side effects to prescribing provider.  Therapeutic Interventions: 1 on 1 counseling sessions, Psychoeducation, Medication administration, Evaluate responses to treatment, Monitor vital signs and CBGs as ordered, Perform/monitor CIWA, COWS, AIMS and Fall Risk screenings as ordered, Perform wound care treatments as ordered.  Evaluation of Outcomes: Progressing   LCSW Treatment Plan for Primary Diagnosis: Bipolar I disorder, most recent episode mixed (HCC) Long Term Goal(s): Safe transition to appropriate next level of care at discharge, Engage patient in therapeutic group addressing interpersonal concerns.  Short Term Goals: Engage patient in aftercare planning with referrals and resources, Increase social support, Increase ability to appropriately verbalize feelings, Increase emotional regulation, Facilitate acceptance of mental health diagnosis and concerns, Facilitate patient progression through stages of change regarding substance use diagnoses and concerns, Identify triggers associated with mental health/substance abuse issues, and Increase skills for wellness and recovery  Therapeutic Interventions: Assess for all discharge needs, 1 to 1 time with Social worker, Explore available resources and support systems, Assess for adequacy in community support network, Educate family and significant other(s) on suicide prevention, Complete Psychosocial Assessment, Interpersonal group therapy.  Evaluation of Outcomes: Progressing   Progress in Treatment: Attending groups: Yes. Participating in groups: Yes. Taking medication as prescribed: Yes. Toleration medication: Yes. Family/Significant other contact made: No, will contact:  Patient declined consents  Patient understands diagnosis: Yes. Discussing patient identified problems/goals with staff: Yes. Medical problems stabilized or resolved: Yes. Denies suicidal/homicidal ideation: Yes. Issues/concerns  per patient self-inventory: No.   New problem(s) identified: No, Describe:  None Reported   New Short Term/Long Term Goal(s): medication stabilization, elimination of SI thoughts, development of comprehensive mental wellness plan.    Patient Goals:  "Get back on my medications "  Discharge Plan or Barriers: Patient recently admitted. CSW will continue to follow and assess for appropriate referrals and possible discharge planning.    Reason for Continuation of Hospitalization: Anxiety Depression Medication stabilization Suicidal ideation  Estimated Length of Stay: 3-5 days   Last 3 [place] Suicide Severity Risk Score: [employer] Admission (Current) from 08/12/2022 in [facility] Most recent reading at 08/12/2022 10:30 PM ED from 08/12/2022 in [facility] Most recent reading at 08/12/2022  3:15 PM ED from 06/18/2022 in [facility] [place] [facility]) Most recent reading at 06/18/2022  6:33 PM  C-SSRS RISK CATEGORY High Risk High Risk No Risk       Last PHQ 2/9 Scores:    06/23/2016    6:11 PM 06/18/2016    4:27 PM 09/13/2015    2:41 PM  Depression screen PHQ 2/9  Decreased Interest 0 0 0  Down, Depressed, Hopeless 0 0 0  PHQ - 2 Score 0 0 0    Scribe for Treatment Team: Charlett Lango 08/14/2022 1:56 PM

## 2022-08-14 NOTE — Group Note (Signed)
Date:  08/14/2022 Time:  10:10 AM  Group Topic/Focus:  Orientation:   The focus of this group is to educate the patient on the purpose and policies of crisis stabilization and provide a format to answer questions about their admission.  The group details unit policies and expectations of patients while admitted.    Participation Level:  Active  Participation Quality:  Appropriate  Affect:  Appropriate  Cognitive:  Appropriate  Insight: Appropriate  Engagement in Group:  Engaged  Modes of Intervention:  Discussion  Additional Comments:     [name] 08/14/2022, 10:10 AM

## 2022-08-14 NOTE — Progress Notes (Signed)
[facility] MD Progress Note  08/14/2022 3:08 PM Yolanda Callahan  MRN:  [id number] Subjective:  'I  was able to sleep through the night with that medicine' Principal Problem: Bipolar I disorder, most recent episode mixed ([place]) Diagnosis: Principal Problem:   Bipolar I disorder, most recent episode mixed ([place]) Active Problems:   GAD (generalized anxiety disorder)   Delta-9-tetrahydrocannabinol (THC) dependence ([place])   Insomnia   Asthma  HPI: Yolanda Callahan is a 32 year old African American female with a past psychiatric history of MDD, GAD and bipolar disorder, who presented to the [facility] health urgent care with complaints of worsening depressive symptoms with a plan to jump off a bridge in the context of financial stressors and relationship issues. Patient was transferred voluntarily [place] for treatment and stabilization of her mood.  24 hour assessment: Patient visible and actively participating in unit activities and groups. Vital signs WNL; bp slightly elevated with bp 140/97, PR 72. BMI 54.29. Patient noted to be medication compliant; Received PRN Hydroxyzine 25 mg for anxiety, Trazodone 50 mg at bedtime for sleep.   Assessment: Patient assessed in her room where she presents alert and oriented. Calm and cooperative. Euthymic mood. Bright affect. Reports improved sleep overnight with medications; feels 'less anxious'. Rates anxiety 4/10, depression 0/10. States improved mood since admission; enjoys unit activities and groups. She denies any SI/HI/AVH or paranoid thoughts. She endorses medication compliance; LaTuda restarted, she denies any side effects at this time. Contracts for safety and denies any safety concerns at this time.   Total Time spent with patient: 30 minutes  Past Psychiatric History: bipolar I disorder, GAD, Insomnia, MDD; inpatient hospitalizations: 2010, 2022 overdose  Past Medical History:  Past Medical History:  Diagnosis Date   Anxiety    Asthma     Chlamydia contact, treated 07/2013   treated 2014, 2015   CIN I (cervical intraepithelial neoplasia I) 2012   Depression    Polycystic ovarian disease    Seasonal allergies     Past Surgical History:  Procedure Laterality Date   COLPOSCOPY  2012   MOUTH SURGERY  2008   WISDOM TOOTH EXTRACTION  06/2013   Family History:  Family History  Problem Relation Age of Onset   Anxiety disorder Father    Hypertension Father    Bipolar disorder Father    Anxiety disorder Paternal Grandmother    Depression Paternal Grandmother    Diabetes Paternal Grandmother    Hypertension Mother    Endometriosis Mother    Heart disease Maternal Grandmother    Fibroids Maternal Grandmother    Osteoporosis Maternal Grandmother    Diabetes Paternal Grandfather    Hypertension Maternal Grandfather    Family Psychiatric History: father, brother: bipolar disorder; suicide attempts: father. Anxiety maternal side  Social History:  Social History   Substance and Sexual Activity  Alcohol Use Yes   Alcohol/week: 5.0 standard drinks of alcohol   Types: 2 Cans of beer, 3 Standard drinks or equivalent per week     Social History   Substance and Sexual Activity  Drug Use No    Social History   Socioeconomic History   Marital status: Single    Spouse name: Not on file   Number of children: Not on file   Years of education: Not on file   Highest education level: Not on file  Occupational History   Not on file  Tobacco Use   Smoking status: Former   Smokeless tobacco: Never  Vaping Use   Vaping Use: Never used  Substance and Sexual Activity   Alcohol use: Yes    Alcohol/week: 5.0 standard drinks of alcohol    Types: 2 Cans of beer, 3 Standard drinks or equivalent per week   Drug use: No   Sexual activity: Yes    Partners: Male    Birth control/protection: Pill  Other Topics Concern   Not on file  Social History Narrative   Not on file   Social Determinants of Health   Financial  Resource Strain: Not on file  Food Insecurity: No Food Insecurity (08/12/2022)   Hunger Vital Sign    Worried About Running Out of Food in the Last Year: Never true    Ran Out of Food in the Last Year: Never true  Transportation Needs: No Transportation Needs (08/12/2022)   PRAPARE - Hydrologist (Medical): No    Lack of Transportation (Non-Medical): No  Physical Activity: Not on file  Stress: Not on file  Social Connections: Not on file   Additional Social History:   Sleep: Good  Appetite:  Good  Current Medications: Current Facility-Administered Medications  Medication Dose Route Frequency Provider Last Rate Last Admin   acetaminophen (TYLENOL) tablet 650 mg  650 mg Oral Q6H PRN Rankin, Shuvon B, NP       alum & mag hydroxide-simeth (MAALOX/MYLANTA) 200-200-20 MG/5ML suspension 30 mL  30 mL Oral Q4H PRN Rankin, Shuvon B, NP       hydrOXYzine (ATARAX) tablet 25 mg  25 mg Oral TID PRN Rankin, Shuvon B, NP   25 mg at 08/13/22 2119   influenza vac split quadrivalent PF (FLUARIX) injection 0.5 mL  0.5 mL Intramuscular Tomorrow-1000 Jace Dowe, Ovid Curd, MD       risperiDONE (RISPERDAL M-TABS) disintegrating tablet 2 mg  2 mg Oral Q8H PRN Mycah Formica, MD       And   LORazepam (ATIVAN) tablet 1 mg  1 mg Oral PRN Umar Patmon, Ovid Curd, MD       And   ziprasidone (GEODON) injection 20 mg  20 mg Intramuscular PRN Shakeeta Godette, Ovid Curd, MD       lurasidone (LATUDA) tablet 20 mg  20 mg Oral Q supper Valita Righter, MD       Followed by   lurasidone (LATUDA) tablet 40 mg  40 mg Oral Q supper Britne Borelli, MD       magnesium hydroxide (MILK OF MAGNESIA) suspension 30 mL  30 mL Oral Daily PRN Rankin, Shuvon B, NP       traZODone (DESYREL) tablet 50 mg  50 mg Oral QHS PRN Rankin, Shuvon B, NP   50 mg at 08/13/22 2119    Lab Results:  Results for orders placed or performed during the hospital encounter of 08/12/22 (from the past 48 hour(s))  POCT Urine Drug  Screen     Status: Abnormal   Collection Time: 08/12/22  6:21 PM  Result Value Ref Range   POC Amphetamine UR None Detected NONE DETECTED (Cut Off Level 1000 ng/mL)   POC Secobarbital (BAR) None Detected NONE DETECTED (Cut Off Level 300 ng/mL)   POC Buprenorphine (BUP) None Detected NONE DETECTED (Cut Off Level 10 ng/mL)   POC Oxazepam (BZO) None Detected NONE DETECTED (Cut Off Level 300 ng/mL)   POC Cocaine UR None Detected NONE DETECTED (Cut Off Level 300 ng/mL)   POC Methamphetamine UR None Detected NONE DETECTED (Cut Off Level 1000 ng/mL)   POC Morphine None Detected  NONE DETECTED (Cut Off Level 300 ng/mL)   POC Methadone UR None Detected NONE DETECTED (Cut Off Level 300 ng/mL)   POC Oxycodone UR None Detected NONE DETECTED (Cut Off Level 100 ng/mL)   POC Marijuana UR Positive (A) NONE DETECTED (Cut Off Level 50 ng/mL)    Blood Alcohol level:  Lab Results  Component Value Date   ETH <10 08/12/2022   ETH <10 53/66/4403    Metabolic Disorder Labs: Lab Results  Component Value Date   HGBA1C 5.3 08/12/2022   MPG 105.41 08/12/2022   MPG 105 06/18/2016   Lab Results  Component Value Date   PROLACTIN 12.8 08/12/2022   PROLACTIN 23.5 01/04/2013   Lab Results  Component Value Date   CHOL 199 08/12/2022   TRIG 85 08/12/2022   HDL 52 08/12/2022   CHOLHDL 3.8 08/12/2022   VLDL 17 08/12/2022   LDLCALC 130 (H) 08/12/2022   LDLCALC 96 06/18/2016    Physical Findings: AIMS:  , ,  ,  ,    CIWA:    COWS:     Musculoskeletal: Strength & Muscle Tone: within normal limits Gait & Station: normal Patient leans: N/A  Psychiatric Specialty Exam:  Presentation  General Appearance:  Appropriate for Environment  Eye Contact: Good  Speech: Clear and Coherent  Speech Volume: Normal  Handedness: Right  Mood and Affect  Mood: Euthymic  Affect: Congruent; Appropriate   Thought Process  Thought Processes: Coherent  Descriptions of  Associations:Intact  Orientation:Full (Time, Place and Person)  Thought Content:Logical  History of Schizophrenia/Schizoaffective disorder:No  Duration of Psychotic Symptoms:No data recorded Hallucinations:Hallucinations: None  Ideas of Reference:None  Suicidal Thoughts:Suicidal Thoughts: No  Homicidal Thoughts:Homicidal Thoughts: No  Sensorium  Memory: Immediate Good; Recent Good  Judgment: Fair  Insight:   Executive Functions  Concentration: Fair  Attention Span: Fair  Recall: [place] of Knowledge: Fair  Language: Fair  Psychomotor Activity  Psychomotor Activity: Psychomotor Activity: Normal  Assets  Assets: Communication Skills; Desire for Improvement; Housing; Social Support; Resilience  Sleep  Sleep: Sleep: Fair  Physical Exam: Physical Exam Vitals and nursing note reviewed.  Constitutional:      Appearance: She is obese. She is not ill-appearing or toxic-appearing.  HENT:     Head: Normocephalic.     Nose: Nose normal.     Mouth/Throat:     Mouth: Mucous membranes are moist.     Pharynx: Oropharynx is clear.  Eyes:     Pupils: Pupils are equal, round, and reactive to light.  Cardiovascular:     Rate and Rhythm: Normal rate.     Pulses: Normal pulses.  Pulmonary:     Effort: Pulmonary effort is normal.  Abdominal:     Palpations: Abdomen is soft.  Musculoskeletal:        General: Normal range of motion.     Cervical back: Normal range of motion.  Skin:    General: Skin is warm and dry.  Neurological:     Mental Status: She is alert and oriented to person, place, and time.  Psychiatric:        Attention and Perception: Attention and perception normal.        Mood and Affect: Mood and affect normal.        Speech: Speech normal.        Behavior: Behavior normal. Behavior is cooperative.        Thought Content: Thought content normal. Thought content is not paranoid or delusional. Thought content does  not include homicidal or  suicidal ideation. Thought content does not include homicidal or suicidal plan.        Cognition and Memory: Cognition and memory normal.        Judgment: Judgment normal.    Review of Systems  Psychiatric/Behavioral:  Positive for substance abuse. Negative for suicidal ideas.   All other systems reviewed and are negative.  Blood pressure (!) 140/97, pulse 72, temperature 98.3 F (36.8 C), temperature source Oral, resp. rate 16, height 5' (1.524 m), weight 126.1 kg, SpO2 100 %. Body mass index is 54.29 kg/m.   Treatment Plan Summary: Daily contact with patient to assess and evaluate symptoms and progress in treatment, Medication management, and Plan   PLAN Safety and Monitoring: Voluntary admission to inpatient psychiatric unit for safety, stabilization and treatment Daily contact with patient to assess and evaluate symptoms and progress in treatment Patient's case to be discussed in multi-disciplinary team meeting Observation Level : q15 minute checks Vital signs: q12 hours Precautions: Safety  Long Term Goal(s): Improvement in symptoms so as ready for discharge  Short Term Goals: Ability to identify changes in lifestyle to reduce recurrence of condition will improve, Ability to verbalize feelings will improve, Ability to disclose and discuss suicidal ideas, Ability to demonstrate self-control will improve, Ability to identify and develop effective coping behaviors will improve, Compliance with prescribed medications will improve, and Ability to identify triggers associated with substance abuse/mental health issues will improve  Diagnoses:  Principal Problem:   Bipolar I disorder, most recent episode mixed ([place]) Active Problems:   GAD (generalized anxiety disorder)   Delta-9-tetrahydrocannabinol (THC) dependence ([place])   Insomnia   Asthma  Medications -Start:  - Latuda to 40 mg on 08/14/2022 for mood stabilization; 60 mg 08/15/22 - Agitation protocol: Risperdal/Ativan/Geodon  as needed-please see MAR for details  Start:  - Influenza vaccine split quadrivalent PR 0.5 mL IM 08/15/22  Other PRNs: -Continue:  - Tylenol 650 mg every 6 hours PRN for mild pain - Maalox 30 mg every 4 hrs PRN for indigestion - Milk of Magnesia as needed every 6 hrs for constipation - Trazodone 50 mg HS PRN for insomnia - Hydroxyzine 35 mg TID PRN for anxiety  Discharge Planning: Social work and case management to assist with discharge planning and identification of hospital follow-up needs prior to discharge Estimated LOS: 5-7 days Discharge Concerns: Need to establish a safety plan; Medication compliance and effectiveness Discharge Goals: Return home with outpatient referrals for mental health follow-up including medication management/psychotherapy  I certify that inpatient services furnished can reasonably be expected to improve the patient's condition.     Inda Merlin, NP 08/14/2022, 3:08 PM  Total Time Spent in Direct Patient Care:  I personally spent 35 minutes on the unit in direct patient care. The direct patient care time included face-to-face time with the patient, reviewing the patient's chart, communicating with other professionals, and coordinating care. Greater than 50% of this time was spent in counseling or coordinating care with the patient regarding goals of hospitalization, psycho-education, and discharge planning needs.  I have independently evaluated the patient during a face-to-face assessment. I reviewed the patient's chart, and I participated in key portions of the service. I discussed the case with the APP, and I agree with the assessment and plan of care as documented in the APP's note , as addended by me or notated below:  Pharmacy did not have latuda for pt yesterday (ordered to start yesterday with dinner). Will start latuda  today. Pt still elevated. Monitor response to restarting latuda over the weekend.   Phineas Inches, MD Psychiatrist
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
## 2022-08-14 NOTE — BHH Suicide Risk Assessment (Signed)
[facility] INPATIENT:  Family/Significant Other Suicide Prevention Education  Suicide Prevention Education:  Patient Refusal for Family/Significant Other Suicide Prevention Education: The patient Yolanda Callahan has refused to provide written consent for family/significant other to be provided Family/Significant Other Suicide Prevention Education during admission and/or prior to discharge.  Physician notified.  Frutoso Chase Jeanita Carneiro 08/14/2022, 1:38 PM

## 2022-08-14 NOTE — Group Note (Signed)
Date:  08/14/2022 Time:  5:05 PM  Group Topic/Focus:  Wellness Toolbox:   The focus of this group is to discuss various aspects of wellness, balancing those aspects and exploring ways to increase the ability to experience wellness.  Patients will create a wellness toolbox for use upon discharge.    Participation Level:  Active  Participation Quality:  Appropriate  Affect:  Appropriate  Cognitive:  Appropriate  Insight: Appropriate  Engagement in Group:  Engaged  Modes of Intervention:  Exploration  Additional Comments:  Patient explored ways of seeing negative situations in her life by looking at the big picture and broadening her perspective.   Yolanda Callahan 08/14/2022, 5:05 PM

## 2022-08-14 NOTE — Group Note (Signed)
Recreation Therapy Group Note   Group Topic:Team Building  Group Date: 08/14/2022 Start Time: 0935 End Time: 1020 Facilitators: Jarmon Javid-McCall, LRT,CTRS Location: [address] Dayroom   Goal Area(s) Addresses:  Patient will effectively work with peer towards shared goal.  Patient will identify skills used to make activity successful.  Patient will identify how skills used during activity can be used to reach post d/c goals.   Group Description: Straw Bridge. In teams of 3-5, patients were given 15 plastic drinking straws and an equal length of masking tape. Using the materials provided, patients were instructed to build a free standing bridge-like structure to suspend an everyday item (ex: puzzle box) off of the floor or table surface. All materials were required to be used by the team in their design. LRT facilitated post-activity discussion reviewing team process. Patients were encouraged to reflect how the skills used in this activity can be generalized to daily life post discharge.   Affect/Mood: N/A   Participation Level: Did not attend    Clinical Observations/Individualized Feedback:     Plan: Continue to engage patient in RT group sessions 2-3x/week.   Rudie Sermons-McCall, LRT,CTRS 08/14/2022 1:14 PM

## 2022-08-14 NOTE — Progress Notes (Signed)
D: Patient is alert, oriented, pleasant, and cooperative. Affect and mood are anxious. Denies SI, HI, AVH, and verbally contracts for safety. Patient denies physical symptoms/pain.    A: Scheduled medications administered per MD order. Support provided. Patient educated on safety on the unit and medications. Routine safety checks every 15 minutes. Patient stated understanding to tell nurse about any new physical symptoms. Patient understands to tell staff of any needs.     R: No adverse drug reactions noted. Patient verbally contracts for safety. Patient remains safe at this time and will continue to monitor.    08/14/22 1100  Psych Admission Type (Psych Patients Only)  Admission Status Voluntary  Psychosocial Assessment  Patient Complaints Anxiety  Eye Contact Fair  Facial Expression Anxious  Affect Anxious  Speech Logical/coherent  Interaction Assertive  Motor Activity Other (Comment) (WDL)  Appearance/Hygiene Unremarkable  Behavior Characteristics Cooperative;Anxious  Mood Anxious  Thought Process  Coherency WDL  Content WDL  Delusions None reported or observed  Perception WDL  Hallucination None reported or observed  Judgment Impaired  Confusion None  Danger to Self  Current suicidal ideation? Denies  Self-Injurious Behavior No self-injurious ideation or behavior indicators observed or expressed   Agreement Not to Harm Self Yes  Description of Agreement verbal  Danger to Others  Danger to Others None reported or observed

## 2022-08-15 DIAGNOSIS — F3160 Bipolar disorder, current episode mixed, unspecified: Secondary | ICD-10-CM | POA: Diagnosis not present

## 2022-08-15 MED ORDER — CLONIDINE HCL 0.1 MG PO TABS
0.1000 mg | ORAL_TABLET | Freq: Four times a day (QID) | ORAL | Status: DC | PRN
  Administered 2022-08-15 – 2022-08-18 (×2): 0.1 mg via ORAL
  Filled 2022-08-15 (×2): qty 1

## 2022-08-15 NOTE — Plan of Care (Signed)
  Problem: Education: Goal: Knowledge of [facility] Education information/materials will improve Outcome: Progressing Goal: Emotional status will improve Outcome: Progressing Goal: Mental status will improve Outcome: Progressing Goal: Verbalization of understanding the information provided will improve Outcome: Progressing   Problem: Activity: Goal: Interest or engagement in activities will improve Outcome: Progressing Goal: Sleeping patterns will improve Outcome: Progressing   Problem: Coping: Goal: Ability to verbalize frustrations and anger appropriately will improve Outcome: Progressing Goal: Ability to demonstrate self-control will improve Outcome: Progressing   

## 2022-08-15 NOTE — [facility] Group Notes (Signed)
.  Psychoeducational Group Note    Date:  08/15/2022 Time:1300-1400    Purpose of Group: . The group focus' on teaching patients on how to identify their needs and their Life Skills:  A group where two lists are made. What people need and what are things that we do that are unhealthy. The lists are developed by the patients and it is explained that we often do the actions that are not healthy to get our list of needs met.  Goal:: to develop the coping skills needed to get their needs met  Participation Level:  Active  Participation Quality:  Appropriate  Affect:  Appropriate  Cognitive:  Oriented  Insight: Improving  Engagement in Group:  Engaged  Modes of Intervention:  Activity, Discussion, Education, and Support  Additional Comments:  Pt rates her energy at a 8.5/10. Participated in the group.  Yolanda Callahan

## 2022-08-15 NOTE — Group Note (Signed)
LCSW Group Therapy Note   No social work group was held; the following was provided to each patient  in lieu of in-person group:  Healthy vs. Unhealthy  Coping Skills and Supports   Unhealthy Qualities                                             Healthy Qualities Works (at first) Works   Stops working or starts hurting Continues working  Fast Usually takes time to develop  Easy Often difficult to learn  Often effortless, can be done without thought Usually takes effort, thinking about it  Usually a habit Usually unknown, has to become a habit  Can do alone Often need to reach out for help   Leads to loss Leads to gain         My Unhealthy Coping Skills                                    My Healthy Coping Skills                       My Unhealthy Supports                                           My Healthy Supports                         Maycen Degregory Grossman-Orr, LCSW 08/15/2022  9:19 AM     

## 2022-08-15 NOTE — Progress Notes (Signed)
[place] Group Notes:  (Nursing/MHT/Case Management/Adjunct)  Date:  08/15/2022  Time:  2000  Type of Therapy:   wrap up group  Participation Level:  Active  Participation Quality:  Appropriate  Affect:  Appropriate  Cognitive:  Alert  Insight:  Improving  Engagement in Group:  Engaged  Modes of Intervention:  Clarification, Education, and Support  Summary of Progress/Problems: Positive thinking and positive change were discussed.   Shellia Cleverly 08/15/2022, 9:16 PM

## 2022-08-15 NOTE — Group Note (Signed)
Date:  08/15/2022 Time:  6:40 PM  Group Topic/Focus:  Orientation:   The focus of this group is to educate the patient on the purpose and policies of crisis stabilization and provide a format to answer questions about their admission.  The group details unit policies and expectations of patients while admitted.    Participation Level:  Active  Participation Quality:  Appropriate  Affect:  Appropriate  Cognitive:  Appropriate  Insight: Appropriate  Engagement in Group:  Engaged  Modes of Intervention:  Discussion  Additional Comments:     [name] 08/15/2022, 6:40 PM

## 2022-08-15 NOTE — Progress Notes (Signed)
D: Patient alert and oriented. Affect/mood reported as improving. Denies SI, HI, AVH, and pain. Patient goal, "to go home."  A: Scheduled medication administered to patient, per MD orders. Support and encouragement provided. Routine safety checks conducted every 15 minutes. Patient informed to notify staff with problems or concerns.   R: No adverse drug reactions noted. Patient contracts for safety at this time. Patient compliant with medications and treatment plan. Patient receptive, calm and cooperative. Patient interacts well with others on unit. Patient remains safe at this time.

## 2022-08-15 NOTE — Progress Notes (Signed)
[facility] MD Progress Note  08/15/2022 9:21 AM DEETTA SIEGMANN  MRN:  [id number]  Principal Problem: Bipolar I disorder, most recent episode mixed ([place]) Diagnosis: Principal Problem:   Bipolar I disorder, most recent episode mixed ([place]) Active Problems:   GAD (generalized anxiety disorder)   Delta-9-tetrahydrocannabinol (THC) dependence ([place])   Insomnia   Asthma  HPI: Reggie Welge is a 32 year old African American female with a past psychiatric history of MDD, GAD and bipolar disorder, who presented to the [facility] health urgent care with complaints of worsening depressive symptoms with a plan to jump off a bridge in the context of financial stressors and relationship issues. Patient was transferred voluntarily [place] for stabilization and treatment of her mood.  24 hour assessment: Patient visible and actively participating in unit activities and groups. Vital signs WNL; bp slightly elevated with bp 140/97, PR 72. BMI 54.29. Consulted with Attending Winfred Leeds, MD; pt started on Clonidine q 6hrs PRN SBP>160 or DBP >100. Patient remains medication compliant; Received PRN Hydroxyzine 25 mg for anxiety, Trazodone 50 mg at bedtime for sleep.   Assessment: Patient assessed where she presents alert and oriented. Calm and cooperative. Euthymic mood. Bright affect. Reports improved sleep overnight. States she currently feels 'low energy but anxious'; feels it may be secondary to medication. Reports possible inadequate intake during the morning; provider and nurse discussed need to take LaTuda with meals for proper absorption and overall need for adequate water intake. Reports plan to follow up with staff if symptoms persists; otherwise denies any dry mouth, headache, nausea/vomiting, and/or dizziness. She rates mood as 6.5/10. Reports overall improvement since admission; enjoys unit activities and groups. She denies any SI/HI/AVH or paranoid thoughts. She endorses medication compliance.  LaTuda restarted Friday; increased this morning. Contracts for safety and denies any safety concerns at this time.   Total Time spent with patient: 30 minutes  Past Psychiatric History: bipolar I disorder, GAD, Insomnia, MDD; inpatient hospitalizations: 2010, 2022 overdose  Past Medical History:  Past Medical History:  Diagnosis Date   Anxiety    Asthma    Chlamydia contact, treated 07/2013   treated 2014, 2015   CIN I (cervical intraepithelial neoplasia I) 2012   Depression    Polycystic ovarian disease    Seasonal allergies     Past Surgical History:  Procedure Laterality Date   COLPOSCOPY  2012   MOUTH SURGERY  2008   WISDOM TOOTH EXTRACTION  06/2013   Family History:  Family History  Problem Relation Age of Onset   Anxiety disorder Father    Hypertension Father    Bipolar disorder Father    Anxiety disorder Paternal Grandmother    Depression Paternal Grandmother    Diabetes Paternal Grandmother    Hypertension Mother    Endometriosis Mother    Heart disease Maternal Grandmother    Fibroids Maternal Grandmother    Osteoporosis Maternal Grandmother    Diabetes Paternal Grandfather    Hypertension Maternal Grandfather    Family Psychiatric History: father, brother: bipolar disorder; suicide attempts: father. Anxiety maternal side  Social History:  Social History   Substance and Sexual Activity  Alcohol Use Yes   Alcohol/week: 5.0 standard drinks of alcohol   Types: 2 Cans of beer, 3 Standard drinks or equivalent per week     Social History   Substance and Sexual Activity  Drug Use No    Social History   Socioeconomic History   Marital status: Single    Spouse  name: Not on file   Number of children: Not on file   Years of education: Not on file   Highest education level: Not on file  Occupational History   Not on file  Tobacco Use   Smoking status: Former   Smokeless tobacco: Never  Vaping Use   Vaping Use: Never used  Substance and Sexual  Activity   Alcohol use: Yes    Alcohol/week: 5.0 standard drinks of alcohol    Types: 2 Cans of beer, 3 Standard drinks or equivalent per week   Drug use: No   Sexual activity: Yes    Partners: Male    Birth control/protection: Pill  Other Topics Concern   Not on file  Social History Narrative   Not on file   Social Determinants of Health   Financial Resource Strain: Not on file  Food Insecurity: No Food Insecurity (08/12/2022)   Hunger Vital Sign    Worried About Running Out of Food in the Last Year: Never true    Ran Out of Food in the Last Year: Never true  Transportation Needs: No Transportation Needs (08/12/2022)   PRAPARE - Hydrologist (Medical): No    Lack of Transportation (Non-Medical): No  Physical Activity: Not on file  Stress: Not on file  Social Connections: Not on file   Additional Social History:   Sleep: Good  Appetite:  Good  Current Medications: Current Facility-Administered Medications  Medication Dose Route Frequency Provider Last Rate Last Admin   acetaminophen (TYLENOL) tablet 650 mg  650 mg Oral Q6H PRN Rankin, Shuvon B, NP       alum & mag hydroxide-simeth (MAALOX/MYLANTA) 200-200-20 MG/5ML suspension 30 mL  30 mL Oral Q4H PRN Rankin, Shuvon B, NP       hydrOXYzine (ATARAX) tablet 25 mg  25 mg Oral TID PRN Rankin, Shuvon B, NP   25 mg at 08/14/22 2111   influenza vac split quadrivalent PF (FLUARIX) injection 0.5 mL  0.5 mL Intramuscular Tomorrow-1000 Massengill, Ovid Curd, MD       risperiDONE (RISPERDAL M-TABS) disintegrating tablet 2 mg  2 mg Oral Q8H PRN Massengill, Nathan, MD       And   LORazepam (ATIVAN) tablet 1 mg  1 mg Oral PRN Massengill, Nathan, MD       And   ziprasidone (GEODON) injection 20 mg  20 mg Intramuscular PRN Massengill, Ovid Curd, MD       lurasidone (LATUDA) tablet 40 mg  40 mg Oral Q supper Massengill, Ovid Curd, MD   40 mg at 08/14/22 1723   lurasidone (LATUDA) tablet 40 mg  40 mg Oral Q breakfast  Leevy-Johnson, Tanette Chauca A, NP   40 mg at 08/15/22 1884   magnesium hydroxide (MILK OF MAGNESIA) suspension 30 mL  30 mL Oral Daily PRN Rankin, Shuvon B, NP       traZODone (DESYREL) tablet 50 mg  50 mg Oral QHS PRN Rankin, Shuvon B, NP   50 mg at 08/14/22 2111    Lab Results:  No results found for this or any previous visit (from the past 48 hour(s)).   Blood Alcohol level:  Lab Results  Component Value Date   [facility] <10 08/12/2022   ETH <10 16/60/6301    Metabolic Disorder Labs: Lab Results  Component Value Date   HGBA1C 5.3 08/12/2022   MPG 105.41 08/12/2022   MPG 105 06/18/2016   Lab Results  Component Value Date   PROLACTIN 12.8 08/12/2022  PROLACTIN 23.5 01/04/2013   Lab Results  Component Value Date   CHOL 199 08/12/2022   TRIG 85 08/12/2022   HDL 52 08/12/2022   CHOLHDL 3.8 08/12/2022   VLDL 17 08/12/2022   LDLCALC 130 (H) 08/12/2022   LDLCALC 96 06/18/2016   Physical Findings: AIMS: Facial and Oral Movements Muscles of Facial Expression: None, normal Lips and Perioral Area: None, normal Jaw: None, normal Tongue: None, normal,Extremity Movements Upper (arms, wrists, hands, fingers): None, normal Lower (legs, knees, ankles, toes): None, normal, Trunk Movements Neck, shoulders, hips: None, normal, Overall Severity Severity of abnormal movements (highest score from questions above): None, normal Incapacitation due to abnormal movements: None, normal Patient's awareness of abnormal movements (rate only patient's report): No Awareness, Dental Status Current problems with teeth and/or dentures?: No Does patient usually wear dentures?: No  CIWA:    COWS:     Musculoskeletal: Strength & Muscle Tone: within normal limits Gait & Station: normal Patient leans: N/A  Psychiatric Specialty Exam:  Presentation  General Appearance:  Appropriate for Environment  Eye Contact: Good  Speech: Clear and Coherent  Speech  Volume: Normal  Handedness: Right  Mood and Affect  Mood: Euthymic  Affect: Congruent; Appropriate  Thought Process  Thought Processes: Coherent  Descriptions of Associations:Intact  Orientation:Full (Time, Place and Person)  Thought Content:Logical  History of Schizophrenia/Schizoaffective disorder:No  Duration of Psychotic Symptoms:No data recorded Hallucinations:Hallucinations: None  Ideas of Reference:None  Suicidal Thoughts:Suicidal Thoughts: No  Homicidal Thoughts:Homicidal Thoughts: No  Sensorium  Memory: Immediate Good; Recent Good  Judgment: Fair  Insight:  Executive Functions  Concentration: Fair  Attention Span: Fair  Recall: Fair  Fund of Knowledge: Fair  Language: Fair  Psychomotor Activity  Psychomotor Activity: Psychomotor Activity: Normal  Assets  Assets: Communication Skills; Desire for Improvement; Housing; Social Support; Resilience  Sleep  Sleep: Sleep: Fair  Physical Exam: Physical Exam Vitals and nursing note reviewed.  Constitutional:      Appearance: She is obese. She is not ill-appearing or toxic-appearing.  HENT:     Head: Normocephalic.     Nose: Nose normal.     Mouth/Throat:     Mouth: Mucous membranes are moist.     Pharynx: Oropharynx is clear.  Eyes:     Pupils: Pupils are equal, round, and reactive to light.  Cardiovascular:     Rate and Rhythm: Normal rate.     Pulses: Normal pulses.  Pulmonary:     Effort: Pulmonary effort is normal.  Abdominal:     Palpations: Abdomen is soft.  Musculoskeletal:        General: Normal range of motion.     Cervical back: Normal range of motion.  Skin:    General: Skin is warm and dry.  Neurological:     Mental Status: She is alert and oriented to person, place, and time.  Psychiatric:        Attention and Perception: Attention and perception normal.        Mood and Affect: Mood and affect normal.        Speech: Speech normal.        Behavior:  Behavior normal. Behavior is cooperative.        Thought Content: Thought content normal. Thought content is not paranoid or delusional. Thought content does not include homicidal or suicidal ideation. Thought content does not include homicidal or suicidal plan.        Cognition and Memory: Cognition and memory normal.  Judgment: Judgment normal.    Review of Systems  Psychiatric/Behavioral:  Positive for substance abuse. Negative for suicidal ideas.   All other systems reviewed and are negative.  Blood pressure (!) 142/103, pulse 97, temperature 98.6 F (37 C), temperature source Oral, resp. rate 16, height 5' (1.524 m), weight 126.1 kg, SpO2 99 %. Body mass index is 54.29 kg/m.  Treatment Plan Summary: Daily contact with patient to assess and evaluate symptoms and progress in treatment, Medication management, and Plan   PLAN Safety and Monitoring: Voluntary admission to inpatient psychiatric unit for safety, stabilization and treatment Daily contact with patient to assess and evaluate symptoms and progress in treatment Patient's case to be discussed in multi-disciplinary team meeting Observation Level : q15 minute checks Vital signs: q12 hours Precautions: Safety  Long Term Goal(s): Improvement in symptoms so as ready for discharge  Short Term Goals: Ability to identify changes in lifestyle to reduce recurrence of condition will improve, Ability to verbalize feelings will improve, Ability to disclose and discuss suicidal ideas, Ability to demonstrate self-control will improve, Ability to identify and develop effective coping behaviors will improve, Compliance with prescribed medications will improve, and Ability to identify triggers associated with substance abuse/mental health issues will improve  Diagnoses:  Principal Problem:   Bipolar I disorder, most recent episode mixed ([facility]) Active Problems:   GAD (generalized anxiety disorder)   Delta-9-tetrahydrocannabinol (THC)  dependence ([facility])   Insomnia   Asthma  Medications -Start:  - Latuda to 40 mg on 08/14/2022 for mood stabilization; 60 mg 08/15/22 - Agitation protocol: Risperdal/Ativan/Geodon as needed-please see MAR for details  Start:  - Influenza vaccine split quadrivalent PR 0.5 mL IM 08/15/22  Other PRNs: -Continue:  - Tylenol 650 mg every 6 hours PRN for mild pain - Maalox 30 mg every 4 hrs PRN for indigestion - Milk of Magnesia as needed every 6 hrs for constipation - Trazodone 50 mg HS PRN for insomnia - Hydroxyzine 35 mg TID PRN for anxiety  Discharge Planning: Social work and case management to assist with discharge planning and identification of hospital follow-up needs prior to discharge Estimated LOS: 5-7 days Discharge Concerns: Need to establish a safety plan; Medication compliance and effectiveness Discharge Goals: Return home with outpatient referrals for mental health follow-up including medication management/psychotherapy  I certify that inpatient services furnished can reasonably be expected to improve the patient's condition.    Loletta Parish, NP 08/15/2022, 9:21 AM  Phineas Inches, MD Psychiatrist  Patient ID: Torrie Mayers, female   DOB: [birth year], 32 y.o.   MRN: [id number]

## 2022-08-15 NOTE — [facility] Group Notes (Signed)
Group Focus: affirmation, clarity of thought, and goals/reality orientation Treatment Modality:  Psychoeducation Interventions utilized were assignment, group exercise, and support Purpose: To be able to understand and verbalize the reason for their admission to the hospital. To understand that the medication helps with their chemical imbalance but they also need to work on their choices in life. To be challenged to develop a list of 30 positives about themselves. Also introduce the concept that "feelings" are not reality.  Participation Level:  Active  Participation Quality:  Appropriate  Affect:  Appropriate  Cognitive:  Appropriate  Insight:  Improving  Engagement in Group:  Engaged  Additional Comments:  ...rates energy at a 6/5. Attended the group and interacted within the group.  Yolanda Callahan

## 2022-08-15 NOTE — Progress Notes (Signed)
Patient alert, oriented and cooperative with care. Patient reports "feeling good" today. Patient stated her father visited her in the hospital, she reports her goal was to "get  use to my medications and attend groups."  Patient denies SI, HI, AVH , pain, anxiety and depression, but requested for Hydroxyzine and Trazodone, same administered per PRN orders. Patient verbally contracts for safety, Q 15 minutes safety checks in place, patient remains safe on the unit and in her room.

## 2022-08-16 DIAGNOSIS — F3160 Bipolar disorder, current episode mixed, unspecified: Secondary | ICD-10-CM | POA: Diagnosis not present

## 2022-08-16 MED ORDER — LURASIDONE HCL 60 MG PO TABS
60.0000 mg | ORAL_TABLET | Freq: Every day | ORAL | Status: DC
  Filled 2022-08-16 (×2): qty 1

## 2022-08-16 MED ORDER — LURASIDONE HCL 60 MG PO TABS
60.0000 mg | ORAL_TABLET | Freq: Every day | ORAL | Status: DC
  Filled 2022-08-16: qty 1

## 2022-08-16 NOTE — Progress Notes (Incomplete)
   08/16/22 0800  Psych Admission Type (Psych Patients Only)  Admission Status Voluntary  Psychosocial Assessment  Patient Complaints None  Eye Contact Fair  Facial Expression Anxious  Affect Anxious  Speech Logical/coherent  Interaction Assertive  Motor Activity Other (Comment) (Unremarkable.)  Appearance/Hygiene Unremarkable  Behavior Characteristics Appropriate to situation;Cooperative  Mood Anxious  Thought Process  Coherency WDL  Content WDL  Delusions None reported or observed  Perception WDL  Hallucination None reported or observed  Judgment Impaired  Confusion WDL  Danger to Self  Current suicidal ideation? Denies  Agreement Not to Harm Self Yes  Description of Agreement Verbal  Danger to Others  Danger to Others None reported or observed

## 2022-08-16 NOTE — Plan of Care (Signed)
  Problem: Safety: Goal: Periods of time without injury will increase Outcome: Progressing   Problem: Self-Concept: Goal: Ability to disclose and discuss suicidal ideas will improve Outcome: Progressing   

## 2022-08-16 NOTE — Progress Notes (Signed)
   08/16/22 2300  Psych Admission Type (Psych Patients Only)  Admission Status Voluntary  Psychosocial Assessment  Patient Complaints None  Eye Contact Fair  Facial Expression Anxious  Affect Anxious  Speech Logical/coherent  Interaction Assertive  Motor Activity Other (Comment)  Appearance/Hygiene Unremarkable  Behavior Characteristics Appropriate to situation  Mood Anxious  Thought Process  Coherency WDL  Content WDL  Delusions None reported or observed  Perception WDL  Hallucination None reported or observed  Judgment Impaired  Confusion WDL  Danger to Self  Current suicidal ideation? Denies  Agreement Not to Harm Self Yes  Description of Agreement Verbal  Danger to Others  Danger to Others None reported or observed   Alert/oriented. Makes needs/concerns known to staff. Pleasant cooperative with staff. Denies SI/HI/A/V hallucinations. Med compliant. PRN med given with good effect. Patient states went to group. Will encourage continue compliance and progression towards goals. Verbally contracted for safety. Will continue to monitor.

## 2022-08-16 NOTE — [facility] Group Notes (Signed)
Adult Psychoeducational Group  Date:  08/16/2022 Time: 1300-1400  Group Topic/Focus: Continuation of the group from Saturday. Looking at the lists that were created and talking about what needs to be done with the homework of 30 positives about themselves.                                     Talking about taking their power back and helping themselves to develop a positive self esteem.      Participation Quality:  Appropriate  Affect:  Appropriate  Cognitive:  Oriented  Insight: Improving  Engagement in Group:  Engaged  Modes of Intervention:  Activity, Discussion, Education, and Support  Additional Comments:  9.5  Bryson Dames A

## 2022-08-16 NOTE — Group Note (Signed)
LCSW Group Therapy Note   Group Date: 08/16/2022 Start Time: 9628 End Time: 1145   Type of Therapy and Topic:  Group Therapy:  Communication  Participation Level:  None   Description of Group:    In this group patients will be encouraged to explore how individuals communicate with one another appropriately and inappropriately. Patients will be guided to discuss their thoughts, feelings, and behaviors related to barriers communicating feelings, needs, and stressors. The group will process together ways to execute positive and appropriate communications, with attention given to how one use behavior, tone, and body language to communicate. Patient will be encouraged to reflect on an incident where they were successfully able to communicate and the factors that they believe helped them to communicate. Each patient will be encouraged to identify specific changes they are motivated to make in order to overcome communication barriers with self, peers, authority, and parents. This group will be process-oriented, with patients participating in exploration of their own experiences as well as giving and receiving support and challenging self as well as other group members.  Therapeutic Goals: Patient will identify how people communicate (body language, facial expression, and electronics) Also discuss tone, voice and how these impact what is communicated and how the message is perceived.  Patient will identify feelings (such as fear or worry), thought process and behaviors related to why people internalize feelings rather than express self openly. Patient will identify two changes they are willing to make to overcome communication barriers. Members will then practice through Role Play how to communicate by utilizing psycho-education material (such as I Feel statements and acknowledging feelings rather than displacing on others)   Summary of Patient Progress Patient came to group and accepted the provided  worksheet. Patient did not provide insight nor shared her experience when it came to her communicating with others. Patient filled out her questions on worksheet and afterwards, left group half way through.    Therapeutic Modalities:   Cognitive Behavioral Therapy Solution Focused Therapy Motivational Interviewing Family Systems Approach   Sherre Lain, [place] 08/16/2022  2:16 PM

## 2022-08-16 NOTE — Plan of Care (Signed)
  Problem: Education: Goal: Emotional status will improve Outcome: Progressing   Problem: Coping: Goal: Ability to verbalize frustrations and anger appropriately will improve Outcome: Progressing   Problem: Self-Concept: Goal: Will verbalize positive feelings about self Outcome: Progressing   Problem: Health Behavior/Discharge Planning: Goal: Ability to make decisions will improve Outcome: Progressing

## 2022-08-16 NOTE — Progress Notes (Signed)
[facility] MD Progress Note  08/16/2022 1:06 PM Yolanda Callahan  MRN:  [id number]  Principal Problem: Bipolar I disorder, most recent episode mixed (HCC) Diagnosis: Principal Problem:   Bipolar I disorder, most recent episode mixed (HCC) Active Problems:   GAD (generalized anxiety disorder)   Delta-9-tetrahydrocannabinol (THC) dependence (HCC)   Insomnia   Asthma  HPI: Yolanda Callahan is a 32 year old African American female with a past psychiatric history of MDD, GAD and bipolar disorder, who presented to the [facility] urgent care with complaints of worsening depressive symptoms with a plan to jump off a bridge in the context of financial stressors and relationship issues. Patient was transferred voluntarily [facility] for stabilization and treatment of her mood.  24 hour assessment: Patient visible and actively participating in unit activities and groups. Vital signs remain slightly elevated with bp 131/92, PR 105. BMI 54.29. Consulted with Attending Abbott Pao, MD; pt started on Clonidine q 6hrs PRN SBP>160 or DBP >100 08/15/22. Patient remains medication compliant; Receives PRN Hydroxyzine 25 mg for anxiety, Trazodone 50 mg at bedtime for sleep.   Assessment: Patient assessed where she presents alert and oriented. Calm and cooperative. Euthymic mood. Bright affect. Reports improved sleep overnight, states she currently feels 'more drowsy during the day than normal since the recent increase in [place]'.  Reports previously taking 10 mg daily and stopped in November. Discussed plan to decrease current dose and continue to monitor for any effects. She otherwise denies any dry mouth, headache, nausea/vomiting, dizziness, or any other side effects. Currently works as a TEFL teacher. Reports overall improvement since admission and continues to enjoy unit activities and groups. She denies any active anxiety, depression, SI/HI/AVH, or paranoid thoughts. Contracts for safety and denies  any safety concerns at this time. Plan to decrease Latuda to 60 mg daily with dinner.   Total Time spent with patient: 30 minutes  Past Psychiatric History: bipolar I disorder, GAD, Insomnia, MDD; inpatient hospitalizations: 2010, 2022 overdose  Past Medical History:  Past Medical History:  Diagnosis Date   Anxiety    Asthma    Chlamydia contact, treated 07/2013   treated 2014, 2015   CIN I (cervical intraepithelial neoplasia I) 2012   Depression    Polycystic ovarian disease    Seasonal allergies     Past Surgical History:  Procedure Laterality Date   COLPOSCOPY  2012   MOUTH SURGERY  2008   WISDOM TOOTH EXTRACTION  06/2013   Family History:  Family History  Problem Relation Age of Onset   Anxiety disorder Father    Hypertension Father    Bipolar disorder Father    Anxiety disorder Paternal Grandmother    Depression Paternal Grandmother    Diabetes Paternal Grandmother    Hypertension Mother    Endometriosis Mother    Heart disease Maternal Grandmother    Fibroids Maternal Grandmother    Osteoporosis Maternal Grandmother    Diabetes Paternal Grandfather    Hypertension Maternal Grandfather    Family Psychiatric History: father, brother: bipolar disorder; suicide attempts: father. Anxiety maternal side  Social History:  Social History   Substance and Sexual Activity  Alcohol Use Yes   Alcohol/week: 5.0 standard drinks of alcohol   Types: 2 Cans of beer, 3 Standard drinks or equivalent per week     Social History   Substance and Sexual Activity  Drug Use No    Social History   Socioeconomic History   Marital status: Single  Spouse name: Not on file   Number of children: Not on file   Years of education: Not on file   Highest education level: Not on file  Occupational History   Not on file  Tobacco Use   Smoking status: Former   Smokeless tobacco: Never  Vaping Use   Vaping Use: Never used  Substance and Sexual Activity   Alcohol use: Yes     Alcohol/week: 5.0 standard drinks of alcohol    Types: 2 Cans of beer, 3 Standard drinks or equivalent per week   Drug use: No   Sexual activity: Yes    Partners: Male    Birth control/protection: Pill  Other Topics Concern   Not on file  Social History Narrative   Not on file   Social Determinants of Health   Financial Resource Strain: Not on file  Food Insecurity: No Food Insecurity (08/12/2022)   Hunger Vital Sign    Worried About Running Out of Food in the Last Year: Never true    Ran Out of Food in the Last Year: Never true  Transportation Needs: No Transportation Needs (08/12/2022)   PRAPARE - Administrator, Civil Service (Medical): No    Lack of Transportation (Non-Medical): No  Physical Activity: Not on file  Stress: Not on file  Social Connections: Not on file   Additional Social History:   Sleep: Good  Appetite:  Good  Current Medications: Current Facility-Administered Medications  Medication Dose Route Frequency Provider Last Rate Last Admin   acetaminophen (TYLENOL) tablet 650 mg  650 mg Oral Q6H PRN Rankin, Shuvon B, NP       alum & mag hydroxide-simeth (MAALOX/MYLANTA) 200-200-20 MG/5ML suspension 30 mL  30 mL Oral Q4H PRN Rankin, Shuvon B, NP       cloNIDine (CATAPRES) tablet 0.1 mg  0.1 mg Oral Q6H PRN Leevy-Johnson, Bela Nyborg A, NP   0.1 mg at 08/15/22 1836   hydrOXYzine (ATARAX) tablet 25 mg  25 mg Oral TID PRN Rankin, Shuvon B, NP   25 mg at 08/15/22 2109   influenza vac split quadrivalent PF (FLUARIX) injection 0.5 mL  0.5 mL Intramuscular Tomorrow-1000 Massengill, Harrold Donath, MD       risperiDONE (RISPERDAL M-TABS) disintegrating tablet 2 mg  2 mg Oral Q8H PRN Massengill, Nathan, MD       And   LORazepam (ATIVAN) tablet 1 mg  1 mg Oral PRN Massengill, Nathan, MD       And   ziprasidone (GEODON) injection 20 mg  20 mg Intramuscular PRN Massengill, Harrold Donath, MD       lurasidone (LATUDA) tablet 40 mg  40 mg Oral Q supper Massengill, Harrold Donath, MD   40 mg  at 08/15/22 1809   lurasidone (LATUDA) tablet 40 mg  40 mg Oral Q breakfast Leevy-Johnson, Keryn Nessler A, NP   40 mg at 08/16/22 6789   magnesium hydroxide (MILK OF MAGNESIA) suspension 30 mL  30 mL Oral Daily PRN Rankin, Shuvon B, NP       traZODone (DESYREL) tablet 50 mg  50 mg Oral QHS PRN Rankin, Shuvon B, NP   50 mg at 08/15/22 2109    Lab Results:  No results found for this or any previous visit (from the past 48 hour(s)).   Blood Alcohol level:  Lab Results  Component Value Date   [facility] <10 08/12/2022   ETH <10 09/05/2020    Metabolic Disorder Labs: Lab Results  Component Value Date   HGBA1C 5.3 08/12/2022  MPG 105.41 08/12/2022   MPG 105 06/18/2016   Lab Results  Component Value Date   PROLACTIN 12.8 08/12/2022   PROLACTIN 23.5 01/04/2013   Lab Results  Component Value Date   CHOL 199 08/12/2022   TRIG 85 08/12/2022   HDL 52 08/12/2022   CHOLHDL 3.8 08/12/2022   VLDL 17 08/12/2022   LDLCALC 130 (H) 08/12/2022   LDLCALC 96 06/18/2016   Physical Findings: AIMS: Facial and Oral Movements Muscles of Facial Expression: None, normal Lips and Perioral Area: None, normal Jaw: None, normal Tongue: None, normal,Extremity Movements Upper (arms, wrists, hands, fingers): None, normal Lower (legs, knees, ankles, toes): None, normal, Trunk Movements Neck, shoulders, hips: None, normal, Overall Severity Severity of abnormal movements (highest score from questions above): None, normal Incapacitation due to abnormal movements: None, normal Patient's awareness of abnormal movements (rate only patient's report): No Awareness, Dental Status Current problems with teeth and/or dentures?: No Does patient usually wear dentures?: No  CIWA:    COWS:     Musculoskeletal: Strength & Muscle Tone: within normal limits Gait & Station: normal Patient leans: N/A  Psychiatric Specialty Exam:  Presentation  General Appearance:  Appropriate for Environment; Casual  Eye  Contact: Good  Speech: Clear and Coherent  Speech Volume: Normal  Handedness: Right  Mood and Affect  Mood: Euthymic  Affect: Appropriate; Congruent  Thought Process  Thought Processes: Coherent  Descriptions of Associations:Intact  Orientation:Full (Time, Place and Person)  Thought Content:Logical  History of Schizophrenia/Schizoaffective disorder:No  Duration of Psychotic Symptoms:No data recorded Hallucinations:Hallucinations: None  Ideas of Reference:None  Suicidal Thoughts:Suicidal Thoughts: No  Homicidal Thoughts:Homicidal Thoughts: No  Sensorium  Memory: Immediate Good; Recent Good  Judgment: Good  Insight:  Executive Functions  Concentration: Good  Attention Span: Good  Recall: Good  Fund of Knowledge: Good  Language: Good  Psychomotor Activity  Psychomotor Activity: Psychomotor Activity: Normal  Assets  Assets: Communication Skills; Financial Resources/Insurance; Housing; Physical Health; Resilience; Social Support; Talents/Skills; Vocational/Educational  Sleep  Sleep: Sleep: Fair  Physical Exam: Physical Exam Vitals and nursing note reviewed.  Constitutional:      Appearance: She is obese. She is not ill-appearing or toxic-appearing.  HENT:     Head: Normocephalic.     Nose: Nose normal.     Mouth/Throat:     Mouth: Mucous membranes are moist.     Pharynx: Oropharynx is clear.  Eyes:     Pupils: Pupils are equal, round, and reactive to light.  Cardiovascular:     Rate and Rhythm: Normal rate.     Pulses: Normal pulses.  Pulmonary:     Effort: Pulmonary effort is normal.  Abdominal:     Palpations: Abdomen is soft.  Musculoskeletal:        General: Normal range of motion.     Cervical back: Normal range of motion.  Skin:    General: Skin is warm and dry.  Neurological:     Mental Status: She is alert and oriented to person, place, and time.  Psychiatric:        Attention and Perception: Attention and  perception normal.        Mood and Affect: Mood and affect normal.        Speech: Speech normal.        Behavior: Behavior normal. Behavior is cooperative.        Thought Content: Thought content normal. Thought content is not paranoid or delusional. Thought content does not include homicidal or suicidal ideation. Thought content  does not include homicidal or suicidal plan.        Cognition and Memory: Cognition and memory normal.        Judgment: Judgment normal.    Review of Systems  Psychiatric/Behavioral:  Positive for substance abuse. Negative for suicidal ideas.   All other systems reviewed and are negative.  Blood pressure (!) 131/92, pulse (!) 105, temperature 99.2 F (37.3 C), temperature source Oral, resp. rate 20, height 5' (1.524 m), weight 126.1 kg, SpO2 100 %. Body mass index is 54.29 kg/m.  Treatment Plan Summary: Daily contact with patient to assess and evaluate symptoms and progress in treatment, Medication management, and Plan   PLAN Safety and Monitoring: Voluntary admission to inpatient psychiatric unit for safety, stabilization and treatment Daily contact with patient to assess and evaluate symptoms and progress in treatment Patient's case to be discussed in multi-disciplinary team meeting Observation Level : q15 minute checks Vital signs: q12 hours Precautions: Safety  Long Term Goal(s): Improvement in symptoms so as ready for discharge  Short Term Goals: Ability to identify changes in lifestyle to reduce recurrence of condition will improve, Ability to verbalize feelings will improve, Ability to disclose and discuss suicidal ideas, Ability to demonstrate self-control will improve, Ability to identify and develop effective coping behaviors will improve, Compliance with prescribed medications will improve, and Ability to identify triggers associated with substance abuse/mental health issues will improve  Diagnoses:  Principal Problem:   Bipolar I disorder, most  recent episode mixed ([place]) Active Problems:   GAD (generalized anxiety disorder)   Delta-9-tetrahydrocannabinol (THC) dependence ([place])   Insomnia   Asthma  Medications -Discontinue:  - Latuda to 40 mg twice daily  -Start:  - Latuda 60 mg daily with dinner - Agitation protocol: Risperdal/Ativan/Geodon as needed-please see MAR for details  - Influenza vaccine split quadrivalent PR 0.5 mL IM 08/17/22  Other PRNs: -Continue:  - Tylenol 650 mg every 6 hours PRN for mild pain - Maalox 30 mg every 4 hrs PRN for indigestion - Milk of Magnesia as needed every 6 hrs for constipation - Trazodone 50 mg HS PRN for insomnia - Hydroxyzine 35 mg TID PRN for anxiety  Discharge Planning: Social work and case management to assist with discharge planning and identification of hospital follow-up needs prior to discharge Estimated LOS: 5-7 days Discharge Concerns: Need to establish a safety plan; Medication compliance and effectiveness Discharge Goals: Return home with outpatient referrals for mental health follow-up including medication management/psychotherapy  I certify that inpatient services furnished can reasonably be expected to improve the patient's condition.    Inda Merlin, NP 08/16/2022, 1:06 PM  Patient ID: Yolanda Callahan, female   DOB: 1990/11/09, 32 y.o.   MRN: [id number]

## 2022-08-17 DIAGNOSIS — F3160 Bipolar disorder, current episode mixed, unspecified: Secondary | ICD-10-CM | POA: Diagnosis not present

## 2022-08-17 MED ORDER — LURASIDONE HCL 60 MG PO TABS
60.0000 mg | ORAL_TABLET | Freq: Every day | ORAL | Status: DC
  Administered 2022-08-17 – 2022-08-18 (×2): 60 mg via ORAL
  Filled 2022-08-17 (×4): qty 1

## 2022-08-17 MED ORDER — LURASIDONE HCL 40 MG PO TABS
40.0000 mg | ORAL_TABLET | Freq: Every day | ORAL | Status: DC
  Filled 2022-08-17: qty 1

## 2022-08-17 NOTE — Group Note (Signed)
Occupational Therapy Group Note  Group Topic:Coping Skills  Group Date: 08/17/2022 Start Time: 1415 End Time: 1510 Facilitators: Brantley Stage, OT   Group Description: Group encouraged increased engagement and participation through discussion and activity focused on "Coping Ahead." Patients were split up into teams and selected a card from a stack of positive coping strategies. Patients were instructed to act out/charade the coping skill for other peers to guess and receive points for their team. Discussion followed with a focus on identifying additional positive coping strategies and patients shared how they were going to cope ahead over the weekend while continuing hospitalization stay.  Therapeutic Goal(s): Identify positive vs negative coping strategies. Identify coping skills to be used during hospitalization vs coping skills outside of hospital/at home Increase participation in therapeutic group environment and promote engagement in treatment   Participation Level: Active and Engaged   Participation Quality: Independent   Behavior: Appropriate   Speech/Thought Process: Coherent, Directed, and Focused   Affect/Mood: Appropriate   Insight: Fair   Judgement: Fair   Individualization: pt was active and engaged in their participation of group discussion/activity. New skills identified  Modes of Intervention: Discussion and Education  Patient Response to Interventions:  Attentive   Plan: Continue to engage patient in OT groups 2 - 3x/week.  08/17/2022  Brantley Stage, OT  Cornell Barman, OT

## 2022-08-17 NOTE — Progress Notes (Signed)
   08/17/22 0800  Psych Admission Type (Psych Patients Only)  Admission Status Voluntary  Psychosocial Assessment  Patient Complaints None  Eye Contact Fair  Facial Expression Other (Comment) (Euthymic)  Affect Other (Comment) (Bright)  Speech Logical/coherent  Interaction Assertive  Motor Activity Other (Comment) (WDL)  Appearance/Hygiene Unremarkable  Behavior Characteristics Appropriate to situation  Mood Anxious  Thought Process  Coherency WDL  Content WDL  Delusions None reported or observed  Perception WDL  Hallucination None reported or observed  Judgment Impaired  Confusion WDL  Danger to Self  Current suicidal ideation? Denies  Self-Injurious Behavior No self-injurious ideation or behavior indicators observed or expressed   Agreement Not to Harm Self Yes  Description of Agreement Verbal  Danger to Others  Danger to Others None reported or observed

## 2022-08-17 NOTE — Plan of Care (Signed)
  Problem: Coping: Goal: Ability to verbalize frustrations and anger appropriately will improve Outcome: Progressing Goal: Ability to demonstrate self-control will improve Outcome: Progressing   Problem: Education: Goal: Knowledge of [place] [facility] Education information/materials will improve Outcome: Progressing Goal: Emotional status will improve Outcome: Progressing Goal: Mental status will improve Outcome: Progressing Goal: Verbalization of understanding the information provided will improve Outcome: Progressing

## 2022-08-17 NOTE — [facility] Group Notes (Signed)
Spiritual care group on grief and loss facilitated by chaplain Darline Faith Andree Elk and Lysle Morales, counseling intern.   Group Goal: Support / Education around grief and loss  Members engage in facilitated group support and psycho-social education.  Group Description:  Following introductions and group rules, group members engaged in facilitated group dialogue and support around topic of loss, with particular support around experiences of loss in their lives. Group Identified types of loss (relationships / self / things) and identified patterns, circumstances, and changes that precipitate losses. Reflected on thoughts / feelings around loss, normalized grief responses, and recognized variety in grief experience. Group encouraged individual reflection on safe space and on the coping skills that they are already utilizing.   Group drew on Adlerian / Rogerian and narrative framework  Patient Progress: Yolanda Callahan was an active participant in group.  She was helpful during peer's medical event in holding her up and alerting staff.

## 2022-08-17 NOTE — Progress Notes (Signed)
[facility] MD Progress Note  08/17/2022 2:42 PM Yolanda Callahan  MRN:  [id number]  Principal Problem: Bipolar I disorder, most recent episode mixed (HCC) Diagnosis: Principal Problem:   Bipolar I disorder, most recent episode mixed (HCC) Active Problems:   GAD (generalized anxiety disorder)   Delta-9-tetrahydrocannabinol (THC) dependence (HCC)   Insomnia   Asthma  HPI: Yolanda Callahan is a 32 year old African American female with a past psychiatric history of MDD, GAD and bipolar disorder, who presented to the [facility] health urgent care with complaints of worsening depressive symptoms with a plan to jump off a bridge in the context of financial stressors and relationship issues. Patient was transferred voluntarily [facility] for stabilization and treatment of her mood.  24 hour assessment: V/S running with SBP of 130s to 140s today, patient remains compliant with all scheduled medications, required trazodone 50 mg last night for sleep, and required hydroxyzine 25 mg for anxiety. Pt has been in attendance of most unit group sessions, and there has been no behavioral concerns noted in the past 24 hrs.  Patient assessment note, 08/17/2022: Pt reports that her mood has improved, she reports feeling better as compared to at time of admission. Objectively, there is a slight improvement in mood, affect is congruent.  Pt's attention to personal hygiene and grooming is fair, eye contact is good, speech is clear & coherent. Thought contents are organized and logical, and pt currently denies SI/HI/AVH or paranoia. There is no evidence of delusional thoughts.    Patient reports that she is attending group sessions, and is learning coping mechanisms for her anxiety and impulsivity.  She reports that her sleep has improved since hospitalization, and reports that she is eating more "normally", saying she tends to overeat when depressed.  Patient denies being in any physical pain today, reports that  yesterday she was very drowsy and groggy from the [place].  Patient is agreeable to an adjustment in the dose of her Latuda being made, and we are reducing Latuda from 40 mg BID to 60  every evening with supper.  Over the weekend, patient was getting Latuda 40 mg twice daily, for a total of 80 mg in a 24-hour which caused the grogginess.  It is important that patient remains hospitalized as this medication is being adjusted, so that her treatment team can ascertain that the dose of the Latuda that she is discharged with is enough to manage her mood to prevent the relapse in her symptoms.  We will revisit discharge on 08/19/2022 if safety planning has been completed with CSW, follow-up appointments on the outpatient completed, and patient is tolerating dose of Latuda at that time with no side effects.  We will continue other medications as listed below. No TD/EPS type symptoms found on assessment, and pt denies any feelings of stiffness. AIMS: 0.   Total Time spent with patient: 30 minutes  Past Psychiatric History: bipolar I disorder, GAD, Insomnia, MDD; inpatient hospitalizations: 2010, 2022 overdose  Past Medical History:  Past Medical History:  Diagnosis Date   Anxiety    Asthma    Chlamydia contact, treated 07/2013   treated 2014, 2015   CIN I (cervical intraepithelial neoplasia I) 2012   Depression    Polycystic ovarian disease    Seasonal allergies     Past Surgical History:  Procedure Laterality Date   COLPOSCOPY  2012   MOUTH SURGERY  2008   WISDOM TOOTH EXTRACTION  06/2013   Family History:  Family History  Problem Relation Age of Onset   Anxiety disorder Father    Hypertension Father    Bipolar disorder Father    Anxiety disorder Paternal Grandmother    Depression Paternal Grandmother    Diabetes Paternal Grandmother    Hypertension Mother    Endometriosis Mother    Heart disease Maternal Grandmother    Fibroids Maternal Grandmother    Osteoporosis Maternal Grandmother     Diabetes Paternal Grandfather    Hypertension Maternal Grandfather    Family Psychiatric History: father, brother: bipolar disorder; suicide attempts: father. Anxiety maternal side  Social History:  Social History   Substance and Sexual Activity  Alcohol Use Yes   Alcohol/week: 5.0 standard drinks of alcohol   Types: 2 Cans of beer, 3 Standard drinks or equivalent per week     Social History   Substance and Sexual Activity  Drug Use No    Social History   Socioeconomic History   Marital status: Single    Spouse name: Not on file   Number of children: Not on file   Years of education: Not on file   Highest education level: Not on file  Occupational History   Not on file  Tobacco Use   Smoking status: Former   Smokeless tobacco: Never  Vaping Use   Vaping Use: Never used  Substance and Sexual Activity   Alcohol use: Yes    Alcohol/week: 5.0 standard drinks of alcohol    Types: 2 Cans of beer, 3 Standard drinks or equivalent per week   Drug use: No   Sexual activity: Yes    Partners: Male    Birth control/protection: Pill  Other Topics Concern   Not on file  Social History Narrative   Not on file   Social Determinants of Health   Financial Resource Strain: Not on file  Food Insecurity: No Food Insecurity (08/12/2022)   Hunger Vital Sign    Worried About Running Out of Food in the Last Year: Never true    Ran Out of Food in the Last Year: Never true  Transportation Needs: No Transportation Needs (08/12/2022)   PRAPARE - Administrator, Civil Service (Medical): No    Lack of Transportation (Non-Medical): No  Physical Activity: Not on file  Stress: Not on file  Social Connections: Not on file   Additional Social History:   Sleep: Good  Appetite:  Good  Current Medications: Current Facility-Administered Medications  Medication Dose Route Frequency Provider Last Rate Last Admin   acetaminophen (TYLENOL) tablet 650 mg  650 mg Oral Q6H PRN  Rankin, Shuvon B, NP       alum & mag hydroxide-simeth (MAALOX/MYLANTA) 200-200-20 MG/5ML suspension 30 mL  30 mL Oral Q4H PRN Rankin, Shuvon B, NP       cloNIDine (CATAPRES) tablet 0.1 mg  0.1 mg Oral Q6H PRN Leevy-Johnson, Brooke A, NP   0.1 mg at 08/15/22 1836   hydrOXYzine (ATARAX) tablet 25 mg  25 mg Oral TID PRN Rankin, Shuvon B, NP   25 mg at 08/16/22 2056   influenza vac split quadrivalent PF (FLUARIX) injection 0.5 mL  0.5 mL Intramuscular Tomorrow-1000 Massengill, Nathan, MD       risperiDONE (RISPERDAL M-TABS) disintegrating tablet 2 mg  2 mg Oral Q8H PRN Massengill, Nathan, MD       And   LORazepam (ATIVAN) tablet 1 mg  1 mg Oral PRN Phineas Inches, MD       And  ziprasidone (GEODON) injection 20 mg  20 mg Intramuscular PRN Massengill, Harrold Donath, MD       Lurasidone HCl TABS 60 mg  60 mg Oral Q supper Massengill, Harrold Donath, MD       magnesium hydroxide (MILK OF MAGNESIA) suspension 30 mL  30 mL Oral Daily PRN Rankin, Shuvon B, NP       traZODone (DESYREL) tablet 50 mg  50 mg Oral QHS PRN Rankin, Shuvon B, NP   50 mg at 08/16/22 2056    Lab Results:  No results found for this or any previous visit (from the past 48 hour(s)).   Blood Alcohol level:  Lab Results  Component Value Date   ETH <10 08/12/2022   ETH <10 09/05/2020    Metabolic Disorder Labs: Lab Results  Component Value Date   HGBA1C 5.3 08/12/2022   MPG 105.41 08/12/2022   MPG 105 06/18/2016   Lab Results  Component Value Date   PROLACTIN 12.8 08/12/2022   PROLACTIN 23.5 01/04/2013   Lab Results  Component Value Date   CHOL 199 08/12/2022   TRIG 85 08/12/2022   HDL 52 08/12/2022   CHOLHDL 3.8 08/12/2022   VLDL 17 08/12/2022   LDLCALC 130 (H) 08/12/2022   LDLCALC 96 06/18/2016   Physical Findings: AIMS: Facial and Oral Movements Muscles of Facial Expression: None, normal Lips and Perioral Area: None, normal Jaw: None, normal Tongue: None, normal,Extremity Movements Upper (arms, wrists, hands,  fingers): None, normal Lower (legs, knees, ankles, toes): None, normal, Trunk Movements Neck, shoulders, hips: None, normal, Overall Severity Severity of abnormal movements (highest score from questions above): None, normal Incapacitation due to abnormal movements: None, normal Patient's awareness of abnormal movements (rate only patient's report): No Awareness, Dental Status Current problems with teeth and/or dentures?: No Does patient usually wear dentures?: No  CIWA:    COWS:     Musculoskeletal: Strength & Muscle Tone: within normal limits Gait & Station: normal Patient leans: N/A  Psychiatric Specialty Exam:  Presentation  General Appearance:  Appropriate for Environment; Fairly Groomed  Eye Contact: Good  Speech: Clear and Coherent  Speech Volume: Normal  Handedness: Right  Mood and Affect  Mood: Anxious  Affect: Congruent  Thought Process  Thought Processes: Coherent  Descriptions of Associations:Intact  Orientation:Full (Time, Place and Person)  Thought Content:Logical  History of Schizophrenia/Schizoaffective disorder:No  Duration of Psychotic Symptoms:No data recorded Hallucinations:Hallucinations: Visual  Ideas of Reference:None  Suicidal Thoughts:Suicidal Thoughts: No  Homicidal Thoughts:Homicidal Thoughts: No  Sensorium  Memory: Immediate Good; Recent Good  Judgment: Good  Insight:  Executive Functions  Concentration: Good  Attention Span: Good  Recall: Good  Fund of Knowledge: Good  Language: Good  Psychomotor Activity  Psychomotor Activity: Psychomotor Activity: Normal  Assets  Assets: Communication Skills  Sleep  Sleep: Sleep: Good  Physical Exam: Physical Exam Vitals and nursing note reviewed.  Constitutional:      Appearance: She is obese. She is not ill-appearing or toxic-appearing.  HENT:     Head: Normocephalic.     Nose: Nose normal.     Mouth/Throat:     Mouth: Mucous membranes are  moist.     Pharynx: Oropharynx is clear.  Eyes:     Pupils: Pupils are equal, round, and reactive to light.  Cardiovascular:     Rate and Rhythm: Normal rate.     Pulses: Normal pulses.  Pulmonary:     Effort: Pulmonary effort is normal.  Abdominal:     Palpations: Abdomen is soft.  Musculoskeletal:        General: Normal range of motion.     Cervical back: Normal range of motion.  Skin:    General: Skin is warm and dry.  Neurological:     Mental Status: She is alert and oriented to person, place, and time.  Psychiatric:        Attention and Perception: Attention and perception normal.        Mood and Affect: Mood and affect normal.        Speech: Speech normal.        Behavior: Behavior normal. Behavior is cooperative.        Thought Content: Thought content normal. Thought content is not paranoid or delusional. Thought content does not include homicidal or suicidal ideation. Thought content does not include homicidal or suicidal plan.        Cognition and Memory: Cognition and memory normal.        Judgment: Judgment normal.    Review of Systems  Constitutional: Negative.   HENT: Negative.    Respiratory: Negative.    Cardiovascular: Negative.   Genitourinary: Negative.   Musculoskeletal: Negative.   Skin: Negative.   Neurological: Negative.   Psychiatric/Behavioral:  Positive for substance abuse. Negative for hallucinations, memory loss and suicidal ideas. The patient is nervous/anxious (improving) and has insomnia (improving).   All other systems reviewed and are negative.  Blood pressure (!) 146/80, pulse 88, temperature 98.5 F (36.9 C), temperature source Oral, resp. rate 16, height 5' (1.524 m), weight 126.1 kg, SpO2 98 %. Body mass index is 54.29 kg/m.  Treatment Plan Summary: Daily contact with patient to assess and evaluate symptoms and progress in treatment, Medication management, and Plan   PLAN Safety and Monitoring: Voluntary admission to inpatient  psychiatric unit for safety, stabilization and treatment Daily contact with patient to assess and evaluate symptoms and progress in treatment Patient's case to be discussed in multi-disciplinary team meeting Observation Level : q15 minute checks Vital signs: q12 hours Precautions: Safety  Long Term Goal(s): Improvement in symptoms so as ready for discharge  Short Term Goals: Ability to identify changes in lifestyle to reduce recurrence of condition will improve, Ability to verbalize feelings will improve, Ability to disclose and discuss suicidal ideas, Ability to demonstrate self-control will improve, Ability to identify and develop effective coping behaviors will improve, Compliance with prescribed medications will improve, and Ability to identify triggers associated with substance abuse/mental health issues will improve  Diagnoses:  Principal Problem:   Bipolar I disorder, most recent episode mixed ([place]) Active Problems:   GAD (generalized anxiety disorder)   Delta-9-tetrahydrocannabinol (THC) dependence ([place])   Insomnia   Asthma  Medications - Reduce Latuda from 40mg  BID to 60 mg daily with dinner starting 1/29 @ 1700 -Continue Clonidine 0.1 mg for SBP>160 or DBP>100 - Agitation protocol: Risperdal/Ativan/Geodon as needed-please see MAR for details  - Influenza vaccine split quadrivalent PR 0.5 mL IM 08/17/22  PRN Medications: -Continue:  - Tylenol 650 mg every 6 hours PRN for mild pain - Maalox 30 mg every 4 hrs PRN for indigestion - Milk of Magnesia as needed every 6 hrs for constipation - Trazodone 50 mg HS PRN for insomnia - Hydroxyzine 35 mg TID PRN for anxiety  Discharge Planning: Social work and case management to assist with discharge planning and identification of hospital follow-up needs prior to discharge Estimated LOS: 5-7 days Discharge Concerns: Need to establish a safety plan; Medication compliance and effectiveness Discharge Goals: Return home  with outpatient  referrals for mental health follow-up including medication management/psychotherapy  I certify that inpatient services furnished can reasonably be expected to improve the patient's condition.    Nicholes Rough, NP 08/17/2022, 2:42 PM  Patient ID: Yolanda Callahan, female   DOB: 01-21-1991, 32 y.o.   MRN: [id number] Patient ID: Yolanda Callahan, female   DOB: Feb 10, 1991, 32 y.o.   MRN: [id number]

## 2022-08-18 DIAGNOSIS — F3160 Bipolar disorder, current episode mixed, unspecified: Secondary | ICD-10-CM | POA: Diagnosis not present

## 2022-08-18 MED ORDER — AMLODIPINE BESYLATE 5 MG PO TABS
5.0000 mg | ORAL_TABLET | Freq: Every day | ORAL | Status: DC
  Administered 2022-08-18 – 2022-08-19 (×2): 5 mg via ORAL
  Filled 2022-08-18 (×5): qty 1

## 2022-08-18 NOTE — Progress Notes (Signed)
Adult Psychoeducational Group Note  Date:  08/18/2022 Time:  11:01 AM  Group Topic/Focus:  Goals Group:   The focus of this group is to help patients establish daily goals to achieve during treatment and discuss how the patient can incorporate goal setting into their daily lives to aide in recovery.  Participation Level:  Active  Participation Quality:  Appropriate  Affect:  Appropriate  Cognitive:  Appropriate  Insight: Appropriate  Engagement in Group:  Improving  Modes of Intervention:  Discussion  Additional Comments:  PT participated in group and discussed basic needs to complete personal outside stressors in order to maintain comfort with self. PT confirmed feeling well and ready to progressive thoughts  [facility] 08/18/2022, 11:01 AM

## 2022-08-18 NOTE — Progress Notes (Signed)
[facility] MD Progress Note  08/18/2022 3:54 PM Yolanda Callahan  MRN:  [id number]  Principal Problem: Bipolar I disorder, most recent episode mixed (HCC) Diagnosis: Principal Problem:   Bipolar I disorder, most recent episode mixed (HCC) Active Problems:   GAD (generalized anxiety disorder)   Delta-9-tetrahydrocannabinol (THC) dependence (HCC)   Insomnia   Asthma  HPI: Yolanda Callahan is a 32 year old African American female with a past psychiatric history of MDD, GAD and bipolar disorder, who presented to the [facility] health urgent care with complaints of worsening depressive symptoms with a plan to jump off a bridge in the context of financial stressors and relationship issues. Patient was transferred voluntarily [facility] for stabilization and treatment of her mood.  24 hour assessment: Blood pressure and heart rate elevated today, blood pressure earlier today morning was 149/106 heart rate was 116.  Patient was given clonidine 0.1 mg, rechecked, and was 155/85 with a heart rate of 64.  Patient remains compliant with all scheduled medications, had hydroxyzine 25 mg last night and was also given trazodone 50 mg last night for insomnia.  Patient assessment note, 08/18/2022: Mood today is euthymic, affect is congruent, patient reports a significant improvement in her symptoms since hospitalization. Her attention to personal hygiene and grooming is fair, eye contact is good, speech is clear & coherent. Thought contents are organized and logical, and pt currently denies SI/HI/AVH or paranoia. There is no evidence of delusional thoughts.    Patient reports that current medication regimen is helpful, she reports that she is continuing to benefit from attending unit group sessions, reports feeling less groggy since Yolanda Callahan was reduced to 60 mg daily starting yesterday.  It was necessary for patient to remain hospitalized after the reduction in the dose of her Latuda from 40 mg twice daily to  60 mg daily.  This was necessary to ensure that this dose is adequate for management of patient's symptoms prior to discharge.  Patient reports that her energy level is almost at normal, and verbalizes readiness for discharge.  Plan is to discharge patient tomorrow 08/19/2022, pending safety planning from CSW and appointments for follow-up being made on the outpatient.  We are continuing current medications as listed below, being Norvasc 5 mg daily for management of hypertension.  No TD/EPS type symptoms found on assessment, and pt denies any feelings of stiffness. AIMS: 0.   Total Time spent with patient: 30 minutes  Past Psychiatric History: bipolar I disorder, GAD, Insomnia, MDD; inpatient hospitalizations: 2010, 2022 overdose  Past Medical History:  Past Medical History:  Diagnosis Date   Anxiety    Asthma    Chlamydia contact, treated 07/2013   treated 2014, 2015   CIN I (cervical intraepithelial neoplasia I) 2012   Depression    Polycystic ovarian disease    Seasonal allergies     Past Surgical History:  Procedure Laterality Date   COLPOSCOPY  2012   MOUTH SURGERY  2008   WISDOM TOOTH EXTRACTION  06/2013   Family History:  Family History  Problem Relation Age of Onset   Anxiety disorder Father    Hypertension Father    Bipolar disorder Father    Anxiety disorder Paternal Grandmother    Depression Paternal Grandmother    Diabetes Paternal Grandmother    Hypertension Mother    Endometriosis Mother    Heart disease Maternal Grandmother    Fibroids Maternal Grandmother    Osteoporosis Maternal Grandmother    Diabetes Paternal Grandfather  Hypertension Maternal Grandfather    Family Psychiatric History: father, brother: bipolar disorder; suicide attempts: father. Anxiety maternal side  Social History:  Social History   Substance and Sexual Activity  Alcohol Use Yes   Alcohol/week: 5.0 standard drinks of alcohol   Types: 2 Cans of beer, 3 Standard drinks or  equivalent per week     Social History   Substance and Sexual Activity  Drug Use No    Social History   Socioeconomic History   Marital status: Single    Spouse name: Not on file   Number of children: Not on file   Years of education: Not on file   Highest education level: Not on file  Occupational History   Not on file  Tobacco Use   Smoking status: Former   Smokeless tobacco: Never  Vaping Use   Vaping Use: Never used  Substance and Sexual Activity   Alcohol use: Yes    Alcohol/week: 5.0 standard drinks of alcohol    Types: 2 Cans of beer, 3 Standard drinks or equivalent per week   Drug use: No   Sexual activity: Yes    Partners: Male    Birth control/protection: Pill  Other Topics Concern   Not on file  Social History Narrative   Not on file   Social Determinants of Health   Financial Resource Strain: Not on file  Food Insecurity: No Food Insecurity (08/12/2022)   Hunger Vital Sign    Worried About Running Out of Food in the Last Year: Never true    Ran Out of Food in the Last Year: Never true  Transportation Needs: No Transportation Needs (08/12/2022)   PRAPARE - Hydrologist (Medical): No    Lack of Transportation (Non-Medical): No  Physical Activity: Not on file  Stress: Not on file  Social Connections: Not on file   Additional Social History:   Sleep: Good  Appetite:  Good  Current Medications: Current Facility-Administered Medications  Medication Dose Route Frequency Provider Last Rate Last Admin   acetaminophen (TYLENOL) tablet 650 mg  650 mg Oral Q6H PRN Callahan, Yolanda B, NP   650 mg at 08/18/22 0934   alum & mag hydroxide-simeth (MAALOX/MYLANTA) 200-200-20 MG/5ML suspension 30 mL  30 mL Oral Q4H PRN Callahan, Yolanda B, NP       amLODipine (NORVASC) tablet 5 mg  5 mg Oral Daily Yolanda Steffenhagen, NP       cloNIDine (CATAPRES) tablet 0.1 mg  0.1 mg Oral Q6H PRN Callahan, Yolanda A, NP   0.1 mg at 08/18/22 0934    hydrOXYzine (ATARAX) tablet 25 mg  25 mg Oral TID PRN Callahan, Yolanda B, NP   25 mg at 08/17/22 2212   influenza vac split quadrivalent PF (FLUARIX) injection 0.5 mL  0.5 mL Intramuscular Tomorrow-1000 Callahan, Nathan, MD       risperiDONE (RISPERDAL M-TABS) disintegrating tablet 2 mg  2 mg Oral Q8H PRN Callahan, Nathan, MD       And   LORazepam (ATIVAN) tablet 1 mg  1 mg Oral PRN Callahan, Nathan, MD       And   ziprasidone (GEODON) injection 20 mg  20 mg Intramuscular PRN Callahan, Nathan, MD       Lurasidone HCl TABS 60 mg  60 mg Oral Q supper Callahan, Ovid Curd, MD   60 mg at 08/17/22 1652   magnesium hydroxide (MILK OF MAGNESIA) suspension 30 mL  30 mL Oral Daily PRN Callahan, Yolanda B,  NP       traZODone (DESYREL) tablet 50 mg  50 mg Oral QHS PRN Callahan, Yolanda B, NP   50 mg at 08/17/22 2212    Lab Results:  No results found for this or any previous visit (from the past 48 hour(s)).   Blood Alcohol level:  Lab Results  Component Value Date   ETH <10 08/12/2022   ETH <10 53/61/4431    Metabolic Disorder Labs: Lab Results  Component Value Date   HGBA1C 5.3 08/12/2022   MPG 105.41 08/12/2022   MPG 105 06/18/2016   Lab Results  Component Value Date   PROLACTIN 12.8 08/12/2022   PROLACTIN 23.5 01/04/2013   Lab Results  Component Value Date   CHOL 199 08/12/2022   TRIG 85 08/12/2022   HDL 52 08/12/2022   CHOLHDL 3.8 08/12/2022   VLDL 17 08/12/2022   LDLCALC 130 (H) 08/12/2022   LDLCALC 96 06/18/2016   Physical Findings: AIMS: Facial and Oral Movements Muscles of Facial Expression: None, normal Lips and Perioral Area: None, normal Jaw: None, normal Tongue: None, normal,Extremity Movements Upper (arms, wrists, hands, fingers): None, normal Lower (legs, knees, ankles, toes): None, normal, Trunk Movements Neck, shoulders, hips: None, normal, Overall Severity Severity of abnormal movements (highest score from questions above): None, normal Incapacitation due to  abnormal movements: None, normal Patient's awareness of abnormal movements (rate only patient's report): No Awareness, Dental Status Current problems with teeth and/or dentures?: No Does patient usually wear dentures?: No  CIWA:    COWS:     Musculoskeletal: Strength & Muscle Tone: within normal limits Gait & Station: normal Patient leans: N/A  Psychiatric Specialty Exam:  Presentation  General Appearance:  Appropriate for Environment; Fairly Groomed  Eye Contact: Good  Speech: Clear and Coherent  Speech Volume: Normal  Handedness: Right  Mood and Affect  Mood: Euthymic  Affect: Congruent  Thought Process  Thought Processes: Coherent  Descriptions of Associations:Intact  Orientation:Full (Time, Place and Person)  Thought Content:Logical  History of Schizophrenia/Schizoaffective disorder:No  Duration of Psychotic Symptoms:No data recorded Hallucinations:Hallucinations: None  Ideas of Reference:None  Suicidal Thoughts:Suicidal Thoughts: No  Homicidal Thoughts:Homicidal Thoughts: No  Sensorium  Memory: Immediate Good  Judgment: Good  Insight:  Executive Functions  Concentration: Good  Attention Span: Good  Recall: Good  Fund of Knowledge: Good  Language: Good  Psychomotor Activity  Psychomotor Activity: Psychomotor Activity: Normal  Assets  Assets: Communication Skills  Sleep  Sleep: Sleep: Good  Physical Exam: Physical Exam Vitals and nursing note reviewed.  Constitutional:      Appearance: She is obese. She is not ill-appearing or toxic-appearing.  HENT:     Head: Normocephalic.     Nose: Nose normal.     Mouth/Throat:     Mouth: Mucous membranes are moist.     Pharynx: Oropharynx is clear.  Eyes:     Pupils: Pupils are equal, round, and reactive to light.  Cardiovascular:     Rate and Rhythm: Normal rate.     Pulses: Normal pulses.  Pulmonary:     Effort: Pulmonary effort is normal.  Abdominal:      Palpations: Abdomen is soft.  Musculoskeletal:        General: Normal range of motion.     Cervical back: Normal range of motion.  Skin:    General: Skin is warm and dry.  Neurological:     Mental Status: She is alert and oriented to person, place, and time.  Psychiatric:  Attention and Perception: Attention and perception normal.        Mood and Affect: Mood and affect normal.        Speech: Speech normal.        Behavior: Behavior normal. Behavior is cooperative.        Thought Content: Thought content normal. Thought content is not paranoid or delusional. Thought content does not include homicidal or suicidal ideation. Thought content does not include homicidal or suicidal plan.        Cognition and Memory: Cognition and memory normal.        Judgment: Judgment normal.    Review of Systems  Constitutional: Negative.   HENT: Negative.    Respiratory: Negative.    Cardiovascular: Negative.   Genitourinary: Negative.   Musculoskeletal: Negative.   Skin: Negative.   Neurological: Negative.   Psychiatric/Behavioral:  Positive for substance abuse. Negative for hallucinations, memory loss and suicidal ideas. The patient is nervous/anxious (improving) and has insomnia (improving).   All other systems reviewed and are negative.  Blood pressure (!) 155/85, pulse 64, temperature 98 F (36.7 C), temperature source Oral, resp. rate 16, height 5' (1.524 m), weight 126.1 kg, SpO2 96 %. Body mass index is 54.29 kg/m.  Treatment Plan Summary: Daily contact with patient to assess and evaluate symptoms and progress in treatment, Medication management, and Plan   PLAN Safety and Monitoring: Voluntary admission to inpatient psychiatric unit for safety, stabilization and treatment Daily contact with patient to assess and evaluate symptoms and progress in treatment Patient's case to be discussed in multi-disciplinary team meeting Observation Level : q15 minute checks Vital signs: q12  hours Precautions: Safety  Long Term Goal(s): Improvement in symptoms so as ready for discharge  Short Term Goals: Ability to identify changes in lifestyle to reduce recurrence of condition will improve, Ability to verbalize feelings will improve, Ability to disclose and discuss suicidal ideas, Ability to demonstrate self-control will improve, Ability to identify and develop effective coping behaviors will improve, Compliance with prescribed medications will improve, and Ability to identify triggers associated with substance abuse/mental health issues will improve  Diagnoses:  Principal Problem:   Bipolar I disorder, most recent episode mixed (HCC) Active Problems:   GAD (generalized anxiety disorder)   Delta-9-tetrahydrocannabinol (THC) dependence (HCC)   Insomnia   Asthma  Medications - Continue  60 mg daily with dinner  -Start Norvasc 5 mg daily for hypertension -Continue Clonidine 0.1 mg for SBP>160 or DBP>100 - Agitation protocol: Risperdal/Ativan/Geodon as needed-please see MAR for details  - Influenza vaccine split quadrivalent PR 0.5 mL IM 08/17/22  PRN Medications: -Continue:  - Tylenol 650 mg every 6 hours PRN for mild pain - Maalox 30 mg every 4 hrs PRN for indigestion - Milk of Magnesia as needed every 6 hrs for constipation - Trazodone 50 mg HS PRN for insomnia - Hydroxyzine 35 mg TID PRN for anxiety  Discharge Planning: Social work and case management to assist with discharge planning and identification of hospital follow-up needs prior to discharge Estimated LOS: 5-7 days Discharge Concerns: Need to establish a safety plan; Medication compliance and effectiveness Discharge Goals: Return home with outpatient referrals for mental health follow-up including medication management/psychotherapy  I certify that inpatient services furnished can reasonably be expected to improve the patient's condition.    Nicholes Rough, NP 08/18/2022, 3:54 PM  Patient ID: Yolanda Callahan, female   DOB: Jul 18, 1991, 32 y.o.   MRN: [id number] Patient ID: Yolanda Callahan, female  DOB: [birth year], 32 y.o.   MRN: [id number] Patient ID: Yolanda Callahan, female   DOB: 09/05/1990, 32 y.o.   MRN: [id number]

## 2022-08-18 NOTE — Group Note (Signed)
LCSW Group Therapy Note   Group Date: 08/18/2022 Start Time: 1100 End Time: 1200  Type of Therapy and Topic:  Group Therapy:  Strengths Exploration   Participation Level: Active  Description of Group: This group allows individuals to explore their strengths, learn to use strengths in new ways to improve well-being. Strengths-based interventions involve identifying strengths, understanding how they are used, and learning new ways to apply them. Individuals will identify their strengths, and then explore their roles in different areas of life (relationships, professional life, and personal fulfillment). Individuals will think about ways in which they currently use their strengths, along with new ways they could begin using them.    Therapeutic Goals Patient will verbalize two of their strengths. Patient will identify how their strengths are currently used. Patient will identify two new ways to apply their strengths. Patients will create a plan to apply their strengths in their daily lives.     Summary of Patient Progress:  The Pt attended group and remained there the entire time.  The Pt accepted all worksheets and materials that were provided.  The Pt demonstrated understanding by asking and answering questions.  The Pt was appropriate and respectful with all peers and staff members.      Therapeutic Modalities Cognitive Behavioral Therapy Motivational Interviewing  Darleen Crocker, [place] 08/18/2022  1:08 PM

## 2022-08-18 NOTE — Progress Notes (Signed)

## 2022-08-18 NOTE — Progress Notes (Signed)
Psychoeducational Group Note  Date:  08/18/2022 Time:  2134  Group Topic/Focus:  Wrap-Up Group:   The focus of this group is to help patients review their daily goal of treatment and discuss progress on daily workbooks.  Participation Level: Did Not Attend  Participation Quality:  Not Applicable  Affect:  Not Applicable  Cognitive:  Not Applicable  Insight:  Not Applicable  Engagement in Group: Not Applicable  Additional Comments:  The patient did not attend group this evening.   Archie Balboa S 08/18/2022, 9:34 PM

## 2022-08-18 NOTE — Progress Notes (Signed)
   08/18/22 0900  Psych Admission Type (Psych Patients Only)  Admission Status Voluntary  Psychosocial Assessment  Patient Complaints None  Eye Contact Fair  Facial Expression Other (Comment) (euthymic)  Affect Other (Comment) (bright)  Speech Logical/coherent  Interaction Assertive  Motor Activity Other (Comment) (WDL)  Appearance/Hygiene Unremarkable  Behavior Characteristics Appropriate to situation  Mood Pleasant  Thought Process  Coherency WDL  Content WDL  Delusions None reported or observed  Perception WDL  Hallucination None reported or observed  Judgment Impaired  Confusion WDL  Danger to Self  Current suicidal ideation? Denies  Self-Injurious Behavior No self-injurious ideation or behavior indicators observed or expressed   Agreement Not to Harm Self Yes  Danger to Others  Danger to Others None reported or observed

## 2022-08-19 DIAGNOSIS — F3160 Bipolar disorder, current episode mixed, unspecified: Secondary | ICD-10-CM | POA: Diagnosis not present

## 2022-08-19 MED ORDER — LURASIDONE HCL 60 MG PO TABS: 60.0000 mg | ORAL_TABLET | Freq: Every day | ORAL | 0 refills | Status: AC

## 2022-08-19 MED ORDER — AMLODIPINE BESYLATE 5 MG PO TABS: 5.0000 mg | ORAL_TABLET | Freq: Every day | ORAL | 0 refills | Status: AC

## 2022-08-19 MED ORDER — HYDROXYZINE HCL 25 MG PO TABS: 25.0000 mg | ORAL_TABLET | Freq: Three times a day (TID) | ORAL | 0 refills | Status: AC | PRN

## 2022-08-19 MED ORDER — TRAZODONE HCL 50 MG PO TABS: 50.0000 mg | ORAL_TABLET | Freq: Every evening | ORAL | 0 refills | Status: AC | PRN

## 2022-08-19 NOTE — [facility] Suicide Risk Assessment (Signed)
[facility] Discharge Suicide Risk Assessment   Principal Problem: Bipolar I disorder, most recent episode mixed [facility]) Discharge Diagnoses: Principal Problem:   Bipolar I disorder, most recent episode mixed ([place]) Active Problems:   GAD (generalized anxiety disorder)   Delta-9-tetrahydrocannabinol (THC) dependence ([place])   Insomnia   Asthma   Total Time spent with patient: within normal limits  Reason for admission: Yolanda Callahan is a 32 yo AAF with a past mental health history of MDD, GAD and bipolar disorder, who presented to the [facility] health urgent care with complaints of worsening depressive symptoms with a plan to jump off a bridge in the context of financial stressors and relationship issues.  Patient was transferred voluntarily to the [facility] for treatment and stabilization of her mood.    During the patient's hospitalization, patient had extensive initial psychiatric evaluation, and follow-up psychiatric evaluations every day.  Psychiatric diagnoses provided upon initial assessment:    Bipolar I disorder, most recent episode mixed ([place])   GAD (generalized anxiety disorder)   Delta-9-tetrahydrocannabinol (THC) dependence ([place])   Insomnia   Asthma  Patient's psychiatric medications were adjusted on admission:  -Start Latuda 20 mg today (08/13/2022) for mood stabilization -Increase Latuda to 40 mg on 08/14/2022 for mood stabilization -Start trazodone 50 mg nightly as needed for sleep -Continue hydroxyzine 25 mg 3 times daily as needed for anxiety   During the hospitalization, other adjustments were made to the patient's psychiatric medication regimen:  -Latuda was titrated to 60 mg at bedtime  -norvasc was started for htn - pt to f/u with pcp for this   Patient's care was discussed during the interdisciplinary team meeting every day during the hospitalization.  The patient reported sedation due to latuda, and latuda dose was decreased for this, she otherwise  denied having any side effects to prescribed psychiatric medication.  Gradually, patient started adjusting to milieu. The patient was evaluated each day by a clinical provider to ascertain response to treatment. Improvement was noted by the patient's report of decreasing symptoms, improved sleep and appetite, affect, medication tolerance, behavior, and participation in unit programming.  Patient was asked each day to complete a self inventory noting mood, mental status, pain, new symptoms, anxiety and concerns.    Symptoms were reported as significantly decreased or resolved completely by discharge.   On day of discharge, the patient reports that their mood is stable. The patient denied having suicidal thoughts for more than 48 hours prior to discharge.  Patient denies having homicidal thoughts.  Patient denies having auditory hallucinations.  Patient denies any visual hallucinations or other symptoms of psychosis. The patient was motivated to continue taking medication with a goal of continued improvement in mental health.   The patient reports their target psychiatric symptoms of mised depressive and manic symptoms, along with suicidal thoughts, all responded well to the psychiatric medications, and the patient reports overall benefit other psychiatric hospitalization. Supportive psychotherapy was provided to the patient. The patient also participated in regular group therapy while hospitalized. Coping skills, problem solving as well as relaxation therapies were also part of the unit programming.  Labs were reviewed with the patient, and abnormal results were discussed with the patient.  The patient is able to verbalize their individual safety plan to this provider.  # It is recommended to the patient to continue psychiatric medications as prescribed, after discharge from the hospital.    # It is recommended to the patient to follow up with your outpatient psychiatric provider  and PCP.  # It was  discussed with the patient, the impact of alcohol, drugs, tobacco have been there overall psychiatric and medical wellbeing, and total abstinence from substance use was recommended the patient.ed.  # Prescriptions provided or sent directly to preferred pharmacy at discharge. Patient agreeable to plan. Given opportunity to ask questions. Appears to feel comfortable with discharge.    # In the event of worsening symptoms, the patient is instructed to call the crisis hotline, 911 and or go to the nearest ED for appropriate evaluation and treatment of symptoms. To follow-up with primary care provider for other medical issues, concerns and or health care needs  # Patient was discharged home, with a plan to follow up as noted below.      Psychiatric Specialty Exam  Presentation  General Appearance:  Appropriate for Environment; Casual; Fairly Groomed  Eye Contact: Good  Speech: Normal Rate; Clear and Coherent  Speech Volume: Normal  Handedness: Right   Mood and Affect  Mood: Euthymic  Duration of Depression Symptoms: Greater than two weeks  Affect: Appropriate; Congruent; Full Range   Thought Process  Thought Processes: Linear  Descriptions of Associations:Intact  Orientation:Full (Time, Place and Person)  Thought Content:Logical  History of Schizophrenia/Schizoaffective disorder:No  Duration of Psychotic Symptoms:No data recorded Hallucinations:Hallucinations: None  Ideas of Reference:None  Suicidal Thoughts:Suicidal Thoughts: No  Homicidal Thoughts:Homicidal Thoughts: No   Sensorium  Memory: Immediate Good; Recent Good; Remote Good  Judgment: Good  Insight: Good   Executive Functions  Concentration: Good  Attention Span: Good  Recall: Good  Fund of Knowledge: Good  Language: Good   Psychomotor Activity  Psychomotor Activity: Psychomotor Activity: Normal   Assets  Assets: Communication Skills   Sleep  Sleep: Sleep:  Fair   Physical Exam: Physical Exam See discharge summary  ROS See discharge summary  Blood pressure (!) 139/91, pulse (!) 133, temperature 98.5 F (36.9 C), temperature source Oral, resp. rate 16, height 5' (1.524 m), weight 126.1 kg, SpO2 96 %. Body mass index is 54.29 kg/m.  Mental Status Per Nursing Assessment::   On Admission:  NA  Demographic Factors:  Adolescent or young adult  Loss Factors: Financial problems/change in socioeconomic status  Historical Factors:  Prior suicide attempts, Family history of mental illness or substance abuse  Risk Reduction Factors:  Employed, Positive coping skills or problem solving skills, Living with another person, especially a relative, Sense of responsibility to family, Positive social support  Continued Clinical Symptoms:  Bipolar d/o - mood is stable. Sleep is stable. Denying SI, HI.   Cognitive Features That Contribute To Risk:  None    Suicide Risk:  Mild:  There are no identifiable suicide plans, no associated intent, mild dysphoria and related symptoms, good self-control (both objective and subjective assessment), few other risk factors, and identifiable protective factors, including available and accessible social support.    Follow-up [facility]. Go on 08/21/2022.   Why: You have an appointment for therapy services on  08/21/22 at 10:00 am. The appointment will be held in person. Contact information: [place], [place], [place] [address] [phone]         [facility]. Go on 09/09/2022.   Why: You have an appointment for medication management services on  09/09/22 at 3:00 pm. The appointment will be held in person. Contact information: [place] [address] [phone]  Plan Of Care/Follow-up recommendations:   Activity: as tolerated  Diet: heart healthy  Other: -Follow-up with  your outpatient psychiatric provider -instructions on appointment date, time, and address (location) are provided to you in discharge paperwork.  -Take your psychiatric medications as prescribed at discharge - instructions are provided to you in the discharge paperwork  -Follow-up with outpatient primary care doctor and other specialists -for management of preventative medicine and chronic medical disease, including: asthma, hypertension  -Recommend abstinence from alcohol, tobacco, and other illicit drug use at discharge.   -If your psychiatric symptoms recur, worsen, or if you have side effects to your psychiatric medications, call your outpatient psychiatric provider, 911, 988 or go to the nearest emergency department.  -If suicidal thoughts recur, call your outpatient psychiatric provider, 911, 988 or go to the nearest emergency department.   Christoper Allegra, MD 08/19/2022, 10:11 AM

## 2022-08-19 NOTE — Progress Notes (Signed)
Pt discharged at 1134. Pt left with all belongings and prescriptions for medications given to pt. Pt given suicide safety plan to keep. Pt denied SI/HI/AVH at time of discharge. No further questions/concerns voiced following discharge education.

## 2022-08-19 NOTE — Discharge Instructions (Signed)
-  Follow-up with your outpatient psychiatric provider -instructions on appointment date, time, and address (location) are provided to you in discharge paperwork.  -Take your psychiatric medications as prescribed at discharge - instructions are provided to you in the discharge paperwork  -Follow-up with outpatient primary care doctor and other specialists -for management of preventative medicine and any chronic medical disease.  -Recommend abstinence from alcohol, tobacco, and other illicit drug use at discharge.   -If your psychiatric symptoms recur, worsen, or if you have side effects to your psychiatric medications, call your outpatient psychiatric provider, 911, 988 or go to the nearest emergency department.  -If suicidal thoughts occur, call your outpatient psychiatric provider, 911, 988 or go to the nearest emergency department.  Naloxone (Narcan) can help reverse an overdose when given to the victim quickly.  [place] offers free naloxone kits and instructions/training on its use.  Add naloxone to your first aid kit and you can help save a life.   Pick up your free kit at the following locations:   [place]:  [facility], [address] [place] [address] ([phone]) [facility] [address] [place] [id number] ([phone]) [facility] [address] [place] [address]  [place]: [facility] [address] [place] [address] ([phone]) [facility] [address] [place] [address] ([phone])  

## 2022-08-19 NOTE — Progress Notes (Signed)
   08/18/22 2300  Psych Admission Type (Psych Patients Only)  Admission Status Voluntary  Psychosocial Assessment  Patient Complaints None  Eye Contact Fair  Facial Expression Animated  Affect Appropriate to circumstance  Speech Logical/coherent  Interaction Assertive  Motor Activity Slow  Appearance/Hygiene Unremarkable  Behavior Characteristics Appropriate to situation  Mood Pleasant  Thought Process  Coherency WDL  Content WDL  Delusions None reported or observed  Perception WDL  Hallucination None reported or observed  Judgment Poor  Confusion None  Danger to Self  Current suicidal ideation? Denies  Self-Injurious Behavior No self-injurious ideation or behavior indicators observed or expressed   Agreement Not to Harm Self Yes  Description of Agreement verbal  Danger to Others  Danger to Others None reported or observed

## 2022-08-19 NOTE — Discharge Summary (Signed)
Physician Discharge Summary Note  Patient:  Yolanda Callahan is an 32 y.o., female MRN:  [id number] DOB:  05-Jun-1991 Patient phone:  [phone] (home)  Patient address:   [address] [place] [address],  Total Time spent with patient: 15 minutes  Date of Admission:  08/12/2022 Date of Discharge: 08-19-2022  Reason for Admission:    Reason for admission: Yolanda Callahan is a 32 yo AAF with a past mental health history of MDD, GAD and bipolar disorder, who presented to the [facility] health urgent care with complaints of worsening depressive symptoms with a plan to jump off a bridge in the context of financial stressors and relationship issues.  Patient was transferred voluntarily to the [facility] for treatment and stabilization of her mood.     Principal Problem: Bipolar I disorder, most recent episode mixed [facility]) Discharge Diagnoses: Principal Problem:   Bipolar I disorder, most recent episode mixed (HCC) Active Problems:   GAD (generalized anxiety disorder)   Delta-9-tetrahydrocannabinol (THC) dependence (HCC)   Insomnia   Asthma   Past Psychiatric History:  Previous Psych Diagnoses: bipolar d/o, MDD, GAD Prior inpatient treatment: 11/01/2008-[facility]. 09/05/2020 at [facility] for suicide attempt, then transferred to the [facility] Current/prior outpatient treatment: States she sees online provider based in [place], wants to change to in person Prior rehab hx: Denies Psychotherapy hx: Denies History of suicide attempt: Twice-at age 79 via overdosing on cough syrup, and in 2022 via overdosing on Klonopin and "Christiane Ha" History of homicide or aggression: Denies     Past Medical History:  Past Medical History:  Diagnosis Date   Anxiety    Asthma    Chlamydia contact, treated 07/2013   treated 2014, 2015   CIN I (cervical intraepithelial neoplasia I) 2012   Depression    Polycystic ovarian disease    Seasonal allergies     Past Surgical History:   Procedure Laterality Date   COLPOSCOPY  2012   MOUTH SURGERY  2008   WISDOM TOOTH EXTRACTION  06/2013   Family History:  Family History  Problem Relation Age of Onset   Anxiety disorder Father    Hypertension Father    Bipolar disorder Father    Anxiety disorder Paternal Grandmother    Depression Paternal Grandmother    Diabetes Paternal Grandmother    Hypertension Mother    Endometriosis Mother    Heart disease Maternal Grandmother    Fibroids Maternal Grandmother    Osteoporosis Maternal Grandmother    Diabetes Paternal Grandfather    Hypertension Maternal Grandfather    Family Psychiatric  History:  Medical: Hypertension Psych: Bipolar disorder in father, bipolar disorder in brother, father has attempted suicide multiple times.  Anxiety in mother's side of the family. Psych Rx: Unsure SA/HA: Denies Substance use family hx: Denies    Social History:  Social History   Substance and Sexual Activity  Alcohol Use Yes   Alcohol/week: 5.0 standard drinks of alcohol   Types: 2 Cans of beer, 3 Standard drinks or equivalent per week     Social History   Substance and Sexual Activity  Drug Use No    Social History   Socioeconomic History   Marital status: Single    Spouse name: Not on file   Number of children: Not on file   Years of education: Not on file   Highest education level: Not on file  Occupational History   Not on file  Tobacco Use   Smoking status: Former  Smokeless tobacco: Never  Vaping Use   Vaping Use: Never used  Substance and Sexual Activity   Alcohol use: Yes    Alcohol/week: 5.0 standard drinks of alcohol    Types: 2 Cans of beer, 3 Standard drinks or equivalent per week   Drug use: No   Sexual activity: Yes    Partners: Male    Birth control/protection: Pill  Other Topics Concern   Not on file  Social History Narrative   Not on file   Social Determinants of Health   Financial Resource Strain: Not on file  Food Insecurity: No  Food Insecurity (08/12/2022)   Hunger Vital Sign    Worried About Running Out of Food in the Last Year: Never true    Ran Out of Food in the Last Year: Never true  Transportation Needs: No Transportation Needs (08/12/2022)   PRAPARE - Administrator, Civil Service (Medical): No    Lack of Transportation (Non-Medical): No  Physical Activity: Not on file  Stress: Not on file  Social Connections: Not on file    Hospital Course:       During the patient's hospitalization, patient had extensive initial psychiatric evaluation, and follow-up psychiatric evaluations every day.   Psychiatric diagnoses provided upon initial assessment:    Bipolar I disorder, most recent episode mixed (HCC)   GAD (generalized anxiety disorder)   Delta-9-tetrahydrocannabinol (THC) dependence (HCC)   Insomnia   Asthma   Patient's psychiatric medications were adjusted on admission:  -Start Latuda 20 mg today (08/13/2022) for mood stabilization -Increase Latuda to 40 mg on 08/14/2022 for mood stabilization -Start trazodone 50 mg nightly as needed for sleep -Continue hydroxyzine 25 mg 3 times daily as needed for anxiety     During the hospitalization, other adjustments were made to the patient's psychiatric medication regimen:  -Latuda was titrated to 60 mg at bedtime  -norvasc was started for htn - pt to f/u with pcp for this    Patient's care was discussed during the interdisciplinary team meeting every day during the hospitalization.   The patient reported sedation due to latuda, and latuda dose was decreased for this, she otherwise denied having any side effects to prescribed psychiatric medication.   Gradually, patient started adjusting to milieu. The patient was evaluated each day by a clinical provider to ascertain response to treatment. Improvement was noted by the patient's report of decreasing symptoms, improved sleep and appetite, affect, medication tolerance, behavior, and participation in  unit programming.  Patient was asked each day to complete a self inventory noting mood, mental status, pain, new symptoms, anxiety and concerns.     Symptoms were reported as significantly decreased or resolved completely by discharge.    On day of discharge, the patient reports that their mood is stable. The patient denied having suicidal thoughts for more than 48 hours prior to discharge.  Patient denies having homicidal thoughts.  Patient denies having auditory hallucinations.  Patient denies any visual hallucinations or other symptoms of psychosis. The patient was motivated to continue taking medication with a goal of continued improvement in mental health.    The patient reports their target psychiatric symptoms of mised depressive and manic symptoms, along with suicidal thoughts, all responded well to the psychiatric medications, and the patient reports overall benefit other psychiatric hospitalization. Supportive psychotherapy was provided to the patient. The patient also participated in regular group therapy while hospitalized. Coping skills, problem solving as well as relaxation therapies were also part of  the unit programming.   Labs were reviewed with the patient, and abnormal results were discussed with the patient.   The patient is able to verbalize their individual safety plan to this provider.   # It is recommended to the patient to continue psychiatric medications as prescribed, after discharge from the hospital.     # It is recommended to the patient to follow up with your outpatient psychiatric provider and PCP.   # It was discussed with the patient, the impact of alcohol, drugs, tobacco have been there overall psychiatric and medical wellbeing, and total abstinence from substance use was recommended the patient.ed.   # Prescriptions provided or sent directly to preferred pharmacy at discharge. Patient agreeable to plan. Given opportunity to ask questions. Appears to feel  comfortable with discharge.    # In the event of worsening symptoms, the patient is instructed to call the crisis hotline, 911 and or go to the nearest ED for appropriate evaluation and treatment of symptoms. To follow-up with primary care provider for other medical issues, concerns and or health care needs   # Patient was discharged home, with a plan to follow up as noted below.       Physical Findings: AIMS: Facial and Oral Movements Muscles of Facial Expression: None, normal Lips and Perioral Area: None, normal Jaw: None, normal Tongue: None, normal,Extremity Movements Upper (arms, wrists, hands, fingers): None, normal Lower (legs, knees, ankles, toes): None, normal, Trunk Movements Neck, shoulders, hips: None, normal, Overall Severity Severity of abnormal movements (highest score from questions above): None, normal Incapacitation due to abnormal movements: None, normal Patient's awareness of abnormal movements (rate only patient's report): No Awareness, Dental Status Current problems with teeth and/or dentures?: No Does patient usually wear dentures?: No  CIWA:    COWS:     Musculoskeletal: Strength & Muscle Tone: within normal limits Gait & Station: normal Patient leans: N/A   Psychiatric Specialty Exam:  Presentation  General Appearance:  Appropriate for Environment; Casual; Fairly Groomed  Eye Contact: Good  Speech: Normal Rate; Clear and Coherent  Speech Volume: Normal  Handedness: Right   Mood and Affect  Mood: Euthymic  Affect: Appropriate; Congruent; Full Range   Thought Process  Thought Processes: Linear  Descriptions of Associations:Intact  Orientation:Full (Time, Place and Person)  Thought Content:Logical  History of Schizophrenia/Schizoaffective disorder:No  Duration of Psychotic Symptoms:No data recorded Hallucinations:Hallucinations: None  Ideas of Reference:None  Suicidal Thoughts:Suicidal Thoughts: No  Homicidal  Thoughts:Homicidal Thoughts: No   Sensorium  Memory: Immediate Good; Recent Good; Remote Good  Judgment: Good  Insight: Good   Executive Functions  Concentration: Good  Attention Span: Good  Recall: Good  Fund of Knowledge: Good  Language: Good   Psychomotor Activity  Psychomotor Activity: Psychomotor Activity: Normal   Assets  Assets: Communication Skills   Sleep  Sleep: Sleep: Fair    Physical Exam: Physical Exam Vitals reviewed.  Constitutional:      General: She is not in acute distress.    Appearance: She is not toxic-appearing.  Neurological:     Mental Status: She is alert.     Motor: No weakness.     Gait: Gait normal.  Psychiatric:        Mood and Affect: Mood normal.        Behavior: Behavior normal.        Thought Content: Thought content normal.        Judgment: Judgment normal.    Review of Systems  Constitutional:  Negative for chills and fever.  Cardiovascular:  Negative for chest pain and palpitations.  Neurological:  Negative for dizziness, tingling, tremors and headaches.  Psychiatric/Behavioral:  Negative for depression, hallucinations, memory loss, substance abuse and suicidal ideas. The patient is not nervous/anxious and does not have insomnia.   All other systems reviewed and are negative.  Blood pressure (!) 139/91, pulse (!) 133, temperature 98.5 F (36.9 C), temperature source Oral, resp. rate 16, height 5' (1.524 m), weight 126.1 kg, SpO2 96 %. Body mass index is 54.29 kg/m.   Social History   Tobacco Use  Smoking Status Former  Smokeless Tobacco Never   Tobacco Cessation:  N/A, patient does not currently use tobacco products   Blood Alcohol level:  Lab Results  Component Value Date   ETH <10 08/12/2022   ETH <10 33/82/5053    Metabolic Disorder Labs:  Lab Results  Component Value Date   HGBA1C 5.3 08/12/2022   MPG 105.41 08/12/2022   MPG 105 06/18/2016   Lab Results  Component Value Date    PROLACTIN 12.8 08/12/2022   PROLACTIN 23.5 01/04/2013   Lab Results  Component Value Date   CHOL 199 08/12/2022   TRIG 85 08/12/2022   HDL 52 08/12/2022   CHOLHDL 3.8 08/12/2022   VLDL 17 08/12/2022   LDLCALC 130 (H) 08/12/2022   [place] 96 06/18/2016    See Psychiatric Specialty Exam and Suicide Risk Assessment completed by Attending Physician prior to discharge.  Discharge destination:  Home  Is patient on multiple antipsychotic therapies at discharge:  No   Has Patient had three or more failed trials of antipsychotic monotherapy by history:  No  Recommended Plan for Multiple Antipsychotic Therapies: NA  Discharge Instructions     Diet - low sodium heart healthy   Complete by: As directed    Increase activity slowly   Complete by: As directed       Allergies as of 08/19/2022       Reactions   Penicillins Hives        Medication List     STOP taking these medications    buPROPion 300 MG 24 hr tablet Commonly known as: WELLBUTRIN XL   busPIRone 10 MG tablet Commonly known as: BUSPAR       TAKE these medications      Indication  acetaminophen 500 MG tablet Commonly known as: TYLENOL Take 1,000 mg by mouth every 6 (six) hours as needed for headache.  Indication: Pain   albuterol 108 (90 Base) MCG/ACT inhaler Commonly known as: VENTOLIN HFA Inhale 2 puffs into the lungs every 4 (four) hours as needed for wheezing or shortness of breath.  Indication: Asthma   amLODipine 5 MG tablet Commonly known as: NORVASC Take 1 tablet (5 mg total) by mouth daily. Start taking on: August 20, 2022  Indication: High Blood Pressure Disorder   hydrOXYzine 25 MG tablet Commonly known as: ATARAX Take 1 tablet (25 mg total) by mouth 3 (three) times daily as needed for anxiety.  Indication: Feeling Anxious   Kariva 0.15-0.02/0.01 MG (21/5) tablet Generic drug: desogestrel-ethinyl estradiol Take 1 tablet by mouth daily.  Indication: Birth Control Treatment    Lurasidone HCl 60 MG Tabs Take 1 tablet (60 mg total) by mouth daily with supper. What changed:  medication strength how much to take when to take this  Indication: Depressive Phase of Manic-Depression   multivitamin with minerals Tabs tablet Take 1 tablet by mouth daily.  Indication: vitamin   traZODone  50 MG tablet Commonly known as: DESYREL Take 1 tablet (50 mg total) by mouth at bedtime as needed for sleep.  Indication: [place]. Go on 08/21/2022.   Why: You have an appointment for therapy services on  08/21/22 at 10:00 am. The appointment will be held in person. Contact information: [place], [place], [place] [address] [phone]         [place]. Go on 09/09/2022.   Why: You have an appointment for medication management services on  09/09/22 at 3:00 pm. The appointment will be held in person. Contact information: [place] [address] [phone]                 Follow-up recommendations:    Activity: as tolerated   Diet: heart healthy   Other: -Follow-up with your outpatient psychiatric provider -instructions on appointment date, time, and address (location) are provided to you in discharge paperwork.   -Take your psychiatric medications as prescribed at discharge - instructions are provided to you in the discharge paperwork   -Follow-up with outpatient primary care doctor and other specialists -for management of preventative medicine and chronic medical disease, including: asthma, hypertension   -Recommend abstinence from alcohol, tobacco, and other illicit drug use at discharge.    -If your psychiatric symptoms recur, worsen, or if you have side effects to your psychiatric medications, call your outpatient psychiatric provider, 911, [phone] or go to the nearest emergency department.   -If suicidal  thoughts recur, call your outpatient psychiatric provider, 911, [phone] or go to the nearest emergency department.    Signed: Christoper Allegra, MD 08/19/2022, 10:20 AM   Total Time Spent in Direct Patient Care:  I personally spent 35 minutes on the unit in direct patient care. The direct patient care time included face-to-face time with the patient, reviewing the patient's chart, communicating with other professionals, and coordinating care. Greater than 50% of this time was spent in counseling or coordinating care with the patient regarding goals of hospitalization, psycho-education, and discharge planning needs.   Janine Limbo, MD Psychiatrist

## 2022-08-19 NOTE — Progress Notes (Signed)
  [facility] Adult Case Management Discharge Plan :  Will you be returning to the same living situation after discharge:  Yes,  Home  At discharge, do you have transportation home?: Yes,  Father  Do you have the ability to pay for your medications: Yes,  [employer]   Release of information consent forms completed and in the chart;  Patient's signature needed at discharge.  Patient to Follow up at:  Follow-up [place]. Go on 08/21/2022.   Why: You have an appointment for therapy services on  08/21/22 at 10:00 am. The appointment will be held in person. Contact information: [place], [place], [place] [address] [phone]         [place]. Go on 09/09/2022.   Why: You have an appointment for medication management services on  09/09/22 at 3:00 pm. The appointment will be held in person. Contact information: [place]  [address] [phone]                 Next level of care provider has access to [place] and Suicide Prevention discussed: Yes,  with patient      Has patient been referred to the Quitline?: N/A patient is not a smoker  Patient has been referred for addiction treatment: Pt. refused referral  Darleen Crocker, [place] 08/19/2022, 9:08 AM

## 2022-09-20 ENCOUNTER — Ambulatory Visit
Admission: EM | Admit: 2022-09-20 | Discharge: 2022-09-20 | Disposition: A | Discharge status: Home / Self Care | Payer: BC Managed Care – PPO | Attending: Urgent Care | Admitting: Urgent Care

## 2022-09-20 DIAGNOSIS — L259 Unspecified contact dermatitis, unspecified cause: Secondary | ICD-10-CM

## 2022-09-20 DIAGNOSIS — L03213 Periorbital cellulitis: Secondary | ICD-10-CM

## 2022-09-20 MED ORDER — PREDNISONE 20 MG PO TABS: ORAL_TABLET | ORAL | 0 refills | Status: AC

## 2022-09-20 MED ORDER — CEFDINIR 300 MG PO CAPS: 300.0000 mg | ORAL_CAPSULE | Freq: Two times a day (BID) | ORAL | 0 refills | Status: AC

## 2022-09-20 NOTE — ED Provider Notes (Signed)
[facility] - URGENT CARE C[facility]  Note:  This document was prepared using Systems analyst and may include unintentional dictation errors.  MRN: [id number] DOB: Mar 25, 1991  Subjective:   Yolanda Callahan is a 32 y.o. female presenting for 4-day history of acute onset persistent and worsening left eye redness, swelling, and pain.  Symptoms started after she took off think eyelashes.  She is wondering if using somebody else's tweezers could have affected this.  No redness to the eye itself, drainage.  Has been using Neosporin.  No current facility-administered medications for this encounter.  Current Outpatient Medications:    acetaminophen (TYLENOL) 500 MG tablet, Take 1,000 mg by mouth every 6 (six) hours as needed for headache., Disp: , Rfl:    albuterol (PROVENTIL HFA;VENTOLIN HFA) 108 (90 Base) MCG/ACT inhaler, Inhale 2 puffs into the lungs every 4 (four) hours as needed for wheezing or shortness of breath., Disp: 18 g, Rfl: 3   amLODipine (NORVASC) 5 MG tablet, Take 1 tablet (5 mg total) by mouth daily., Disp: 30 tablet, Rfl: 0   desogestrel-ethinyl estradiol (KARIVA) 0.15-0.02/0.01 MG (21/5) tablet, Take 1 tablet by mouth daily., Disp: , Rfl:    hydrOXYzine (ATARAX) 25 MG tablet, Take 1 tablet (25 mg total) by mouth 3 (three) times daily as needed for anxiety., Disp: 30 tablet, Rfl: 0   Lurasidone HCl 60 MG TABS, Take 1 tablet (60 mg total) by mouth daily with supper., Disp: 30 tablet, Rfl: 0   Multiple Vitamin (MULTIVITAMIN WITH MINERALS) TABS tablet, Take 1 tablet by mouth daily., Disp: , Rfl:    traZODone (DESYREL) 50 MG tablet, Take 1 tablet (50 mg total) by mouth at bedtime as needed for sleep., Disp: 30 tablet, Rfl: 0   Allergies  Allergen Reactions   Penicillins Hives    Past Medical History:  Diagnosis Date   Anxiety    Asthma    Chlamydia contact, treated 07/2013   treated 2014, 2015   CIN I (cervical intraepithelial neoplasia I) 2012   Depression     Polycystic ovarian disease    Seasonal allergies      Past Surgical History:  Procedure Laterality Date   COLPOSCOPY  2012   MOUTH SURGERY  2008   WISDOM TOOTH EXTRACTION  06/2013    Family History  Problem Relation Age of Onset   Anxiety disorder Father    Hypertension Father    Bipolar disorder Father    Anxiety disorder Paternal Grandmother    Depression Paternal Grandmother    Diabetes Paternal Grandmother    Hypertension Mother    Endometriosis Mother    Heart disease Maternal Grandmother    Fibroids Maternal Grandmother    Osteoporosis Maternal Grandmother    Diabetes Paternal Grandfather    Hypertension Maternal Grandfather     Social History   Tobacco Use   Smoking status: Never   Smokeless tobacco: Never  Vaping Use   Vaping Use: Never used  Substance Use Topics   Alcohol use: Yes    Alcohol/week: 5.0 standard drinks of alcohol    Types: 2 Cans of beer, 3 Standard drinks or equivalent per week    Comment: occ   Drug use: Not Currently    ROS   Objective:   Vitals: BP 137/77 (BP Location: Left Arm)   Pulse 70   Temp 99 F (37.2 C) (Oral)   Resp 20   SpO2 97%   Physical Exam Constitutional:      General: She is  not in acute distress.    Appearance: Normal appearance. She is well-developed. She is not ill-appearing, toxic-appearing or diaphoretic.  HENT:     Head: Normocephalic and atraumatic.     Nose: Nose normal.     Mouth/Throat:     Mouth: Mucous membranes are moist.  Eyes:     General: Lids are everted, no foreign bodies appreciated. Vision grossly intact. No scleral icterus.       Right eye: No foreign body, discharge or hordeolum.        Left eye: No foreign body, discharge or hordeolum.     Extraocular Movements: Extraocular movements intact.     Right eye: Normal extraocular motion.     Left eye: Normal extraocular motion and no nystagmus.     Conjunctiva/sclera:     Right eye: Right conjunctiva is not injected. No chemosis,  exudate or hemorrhage.    Left eye: Left conjunctiva is not injected. No chemosis, exudate or hemorrhage.  Cardiovascular:     Rate and Rhythm: Normal rate.  Pulmonary:     Effort: Pulmonary effort is normal.  Skin:    General: Skin is warm and dry.  Neurological:     General: No focal deficit present.     Mental Status: She is alert and oriented to person, place, and time.  Psychiatric:        Mood and Affect: Mood normal.        Behavior: Behavior normal.          Assessment and Plan :   PDMP not reviewed this encounter.  1. Preseptal cellulitis of left upper eyelid   2. Contact dermatitis, unspecified contact dermatitis type, unspecified trigger     Suspected concurrent contact dermatitis from the artificial eyelashes and secondary to preseptal cellulitis.  I will have patient start prednisone and cefdinir.  She has previously taken that antibiotic without any issues.  Recommend avoiding anything else to the skin of the eyelids or face.  Low suspicion for herpes zoster ophthalmicus.  Counseled patient on potential for adverse effects with medications prescribed/recommended today, ER and return-to-clinic precautions discussed, patient verbalized understanding.    [name], [place] 09/20/22 1207

## 2022-09-20 NOTE — ED Triage Notes (Signed)
Pt c/o pain, swelling redness around left eye started 2/29 the day after she took fake eyelashes off with someone else's tweezers-NAD-steady gait

## 2022-10-19 DIAGNOSIS — R7309 Other abnormal glucose: Secondary | ICD-10-CM | POA: Diagnosis not present

## 2022-10-19 DIAGNOSIS — Z13220 Encounter for screening for lipoid disorders: Secondary | ICD-10-CM | POA: Diagnosis not present

## 2022-10-19 DIAGNOSIS — R946 Abnormal results of thyroid function studies: Secondary | ICD-10-CM | POA: Diagnosis not present

## 2022-10-19 DIAGNOSIS — Z79899 Other long term (current) drug therapy: Secondary | ICD-10-CM | POA: Diagnosis not present

## 2022-10-26 DIAGNOSIS — Z0000 Encounter for general adult medical examination without abnormal findings: Secondary | ICD-10-CM | POA: Diagnosis not present

## 2022-10-26 DIAGNOSIS — I10 Essential (primary) hypertension: Secondary | ICD-10-CM | POA: Diagnosis not present

## 2022-10-26 DIAGNOSIS — F3181 Bipolar II disorder: Secondary | ICD-10-CM | POA: Diagnosis not present

## 2024-03-21 ENCOUNTER — Ambulatory Visit: Payer: Self-pay
